# Patient Record
Sex: Female | Born: 1980 | Race: White | Hispanic: No | Smoking: Former smoker
Health system: Southern US, Community
[De-identification: ages and names within clinical notes are randomized; demographics above are authoritative.]

## PROBLEM LIST (undated history)

## (undated) DIAGNOSIS — G932 Benign intracranial hypertension: Secondary | ICD-10-CM

## (undated) DIAGNOSIS — F4310 Post-traumatic stress disorder, unspecified: Secondary | ICD-10-CM

## (undated) DIAGNOSIS — M5030 Other cervical disc degeneration, unspecified cervical region: Secondary | ICD-10-CM

## (undated) DIAGNOSIS — F988 Other specified behavioral and emotional disorders with onset usually occurring in childhood and adolescence: Secondary | ICD-10-CM

## (undated) DIAGNOSIS — F329 Major depressive disorder, single episode, unspecified: Secondary | ICD-10-CM

## (undated) DIAGNOSIS — M5136 Other intervertebral disc degeneration, lumbar region: Secondary | ICD-10-CM

## (undated) DIAGNOSIS — N289 Disorder of kidney and ureter, unspecified: Secondary | ICD-10-CM

## (undated) DIAGNOSIS — R569 Unspecified convulsions: Secondary | ICD-10-CM

## (undated) DIAGNOSIS — E079 Disorder of thyroid, unspecified: Secondary | ICD-10-CM

## (undated) DIAGNOSIS — F419 Anxiety disorder, unspecified: Secondary | ICD-10-CM

## (undated) DIAGNOSIS — G4733 Obstructive sleep apnea (adult) (pediatric): Secondary | ICD-10-CM

## (undated) HISTORY — PX: SHUNT REVISION VENTRICULAR-PERITONEAL: SHX6094

---

## 2003-08-27 ENCOUNTER — Encounter: Payer: Self-pay | Admitting: *Deleted

## 2003-08-27 ENCOUNTER — Encounter: Admission: RE | Admit: 2003-08-27 | Discharge: 2003-08-27 | Payer: Self-pay | Admitting: *Deleted

## 2017-01-18 ENCOUNTER — Ambulatory Visit (INDEPENDENT_AMBULATORY_CARE_PROVIDER_SITE_OTHER): Payer: Self-pay | Admitting: Sports Medicine

## 2017-01-18 DIAGNOSIS — M7711 Lateral epicondylitis, right elbow: Secondary | ICD-10-CM | POA: Insufficient documentation

## 2017-01-18 MED ORDER — MELOXICAM 15 MG PO TABS: ORAL_TABLET | ORAL | 3 refills | Status: DC
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
## 2017-02-15 ENCOUNTER — Ambulatory Visit: Payer: Self-pay | Admitting: Sports Medicine

## 2017-04-16 ENCOUNTER — Emergency Department (HOSPITAL_COMMUNITY): Payer: Self-pay

## 2017-04-16 ENCOUNTER — Encounter (HOSPITAL_COMMUNITY): Payer: Self-pay | Admitting: *Deleted

## 2017-04-16 ENCOUNTER — Emergency Department (HOSPITAL_COMMUNITY)
Admission: EM | Admit: 2017-04-16 | Discharge: 2017-04-16 | Disposition: A | Discharge status: Home / Self Care | Payer: Self-pay | Attending: Emergency Medicine | Admitting: Emergency Medicine

## 2017-04-16 DIAGNOSIS — R109 Unspecified abdominal pain: Secondary | ICD-10-CM | POA: Insufficient documentation

## 2017-04-16 DIAGNOSIS — F17210 Nicotine dependence, cigarettes, uncomplicated: Secondary | ICD-10-CM | POA: Insufficient documentation

## 2017-04-16 DIAGNOSIS — F17290 Nicotine dependence, other tobacco product, uncomplicated: Secondary | ICD-10-CM | POA: Insufficient documentation

## 2017-04-16 DIAGNOSIS — R55 Syncope and collapse: Secondary | ICD-10-CM | POA: Insufficient documentation

## 2017-04-16 LAB — COMPREHENSIVE METABOLIC PANEL
ALT: 19 U/L (ref 14–54)
ANION GAP: 9 (ref 5–15)
AST: 15 U/L (ref 15–41)
Albumin: 4 g/dL (ref 3.5–5.0)
Alkaline Phosphatase: 78 U/L (ref 38–126)
BILIRUBIN TOTAL: 0.6 mg/dL (ref 0.3–1.2)
BUN: 17 mg/dL (ref 6–20)
CHLORIDE: 109 mmol/L (ref 101–111)
CO2: 21 mmol/L — AB (ref 22–32)
Calcium: 8.6 mg/dL — ABNORMAL LOW (ref 8.9–10.3)
Creatinine, Ser: 0.82 mg/dL (ref 0.44–1.00)
GFR calc Af Amer: >60 mL/min (ref >60)
GFR calc non Af Amer: >60 mL/min (ref >60)
Glucose, Bld: 109 mg/dL — ABNORMAL HIGH (ref 65–99)
POTASSIUM: 3.7 mmol/L (ref 3.5–5.1)
SODIUM: 139 mmol/L (ref 135–145)
TOTAL PROTEIN: 7.4 g/dL (ref 6.5–8.1)

## 2017-04-16 LAB — URINALYSIS, ROUTINE W REFLEX MICROSCOPIC
BILIRUBIN URINE: NEGATIVE
GLUCOSE, UA: NEGATIVE mg/dL
KETONES UR: NEGATIVE mg/dL
NITRITE: NEGATIVE
PH: 6 (ref 5.0–8.0)
PROTEIN: 30 mg/dL — AB
Specific Gravity, Urine: 1.01 (ref 1.005–1.030)

## 2017-04-16 LAB — I-STAT BETA HCG BLOOD, ED (MC, WL, AP ONLY): I-stat hCG, quantitative: <5 m[IU]/mL (ref <5)

## 2017-04-16 LAB — CBC
HCT: 42.8 % (ref 36.0–46.0)
Hemoglobin: 14.8 g/dL (ref 12.0–15.0)
MCH: 31 pg (ref 26.0–34.0)
MCHC: 34.6 g/dL (ref 30.0–36.0)
MCV: 89.7 fL (ref 78.0–100.0)
PLATELETS: 343 10*3/uL (ref 150–400)
RBC: 4.77 MIL/uL (ref 3.87–5.11)
RDW: 12.9 % (ref 11.5–15.5)
WBC: 15 10*3/uL — ABNORMAL HIGH (ref 4.0–10.5)

## 2017-04-16 LAB — LIPASE, BLOOD: Lipase: 22 U/L (ref 11–51)

## 2017-04-16 MED ORDER — MORPHINE SULFATE (PF) 4 MG/ML IV SOLN
4.0000 mg | Freq: Once | INTRAVENOUS | Status: AC
  Administered 2017-04-16: 4 mg via INTRAVENOUS
  Filled 2017-04-16: qty 1

## 2017-04-16 MED ORDER — ONDANSETRON 8 MG PO TBDP: 8.0000 mg | ORAL_TABLET | Freq: Three times a day (TID) | ORAL | 0 refills | Status: DC | PRN

## 2017-04-16 MED ORDER — KETOROLAC TROMETHAMINE 30 MG/ML IJ SOLN
30.0000 mg | Freq: Once | INTRAMUSCULAR | Status: AC
  Administered 2017-04-16: 30 mg via INTRAVENOUS
  Filled 2017-04-16: qty 1

## 2017-04-16 MED ORDER — ONDANSETRON HCL 4 MG/2ML IJ SOLN
4.0000 mg | Freq: Once | INTRAMUSCULAR | Status: AC
  Administered 2017-04-16: 4 mg via INTRAVENOUS
  Filled 2017-04-16: qty 2

## 2017-04-16 NOTE — ED Triage Notes (Signed)
Pt c/o severe abdominal pain that caused her to pass out from the pain; pt was given zofran 4mg  en route by ems

## 2017-04-16 NOTE — ED Provider Notes (Signed)
[facility]-EMERGENCY DEPT Provider Note   CSN: [id number] Arrival date & time: 04/16/17  0051     History   Chief Complaint Chief Complaint  Patient presents with  . Loss of Consciousness    HPI Bethany [facility] is a 36 y.o. female.  HPI Patient presents to the emergency department complaining of severe abdominal pain with right flank pain radiating towards her right groin because her significant pain this evening.  She states the pain became so severe that she actually had a syncopal episode.  She was nauseated and vomited.  She was given Zofran by EMS.  No diarrhea.  No fevers or chills.  Denies dysuria or urinary frequency.  Patient has had kidney stones before.  She's also had gallstones.  None of her pain was upper abdominal in nature.  Her pain was severe in severity when it occurred.  She is still describing mild to moderate pain but reports it is significantly improved as compared to earlier.  No chest pain or shortness of breath   No past medical history on file.  Patient Active Problem List   Diagnosis Date Noted  . Lateral epicondylitis, right elbow 01/18/2017    No past surgical history on file.  OB History    No data available       Home Medications    Prior to Admission medications   Medication Sig Start Date End Date Taking? Authorizing Provider  Bethany [facility] (MOBIC) 15 MG tablet One tab PO qAM with breakfast for 2 weeks, then daily prn pain. 01/18/17   Monica Becton, MD    Family History No family history on file.  Social History Social History  Substance Use Topics  . Smoking status: Current Some Day Smoker    Packs/day: 0.25    Types: Cigarettes  . Smokeless tobacco: Current User  . Alcohol use Not on file     Allergies   Latex and Tramadol   Review of Systems Review of Systems  All other systems reviewed and are negative.    Physical Exam Updated Vital Signs BP 135/79   Pulse 75   Temp 98.1 F (36.7 C) (Oral)   Resp 19   Ht  5\' 7"  (1.702 m)   Wt 122.5 kg (270 lb)   LMP 04/13/2017   SpO2 96%   BMI 42.29 kg/m   Physical Exam  Constitutional: She is oriented to person, place, and time. She appears well-developed and well-nourished.  HENT:  Head: Normocephalic.  Eyes: EOM are normal.  Neck: Normal range of motion.  Cardiovascular: Normal rate and regular rhythm.   Pulmonary/Chest: Effort normal and breath sounds normal.  Abdominal: Soft. She exhibits no distension.  Musculoskeletal: Normal range of motion.  Neurological: She is alert and oriented to person, place, and time.  Psychiatric: She has a normal mood and affect.  Nursing note and vitals reviewed.    ED Treatments / Results  Labs (all labs ordered are listed, but only abnormal results are displayed) Labs Reviewed  CBC - Abnormal; Notable for the following:       Result Value   WBC 15.0 (*)    All other components within normal limits  COMPREHENSIVE METABOLIC PANEL - Abnormal; Notable for the following:    CO2 21 (*)    Glucose, Bld 109 (*)    Calcium 8.6 (*)    All other components within normal limits  URINALYSIS, ROUTINE W REFLEX MICROSCOPIC - Abnormal; Notable for the following:    Hgb urine dipstick  LARGE (*)    Protein, ur 30 (*)    Leukocytes, UA TRACE (*)    Bacteria, UA RARE (*)    Squamous Epithelial / LPF 0-5 (*)    All other components within normal limits  URINE CULTURE  LIPASE, BLOOD  I-STAT BETA HCG BLOOD, ED ([facility], [facility], [facility] ONLY)    EKG  EKG Interpretation None       Radiology Ct Renal Stone Study  Result Date: 04/16/2017 CLINICAL DATA:  Severe right-sided abdominal pain. History of kidney stones. EXAM: CT ABDOMEN AND PELVIS WITHOUT CONTRAST TECHNIQUE: Multidetector CT imaging of the abdomen and pelvis was performed following the standard protocol without IV contrast. COMPARISON:  02/23/2015 FINDINGS: Lower chest: Lung bases are clear, line for motion artifact. Hepatobiliary: No focal liver abnormality is seen. No  gallstones, gallbladder wall thickening, or biliary dilatation. Pancreas: Unremarkable. No pancreatic ductal dilatation or surrounding inflammatory changes. Spleen: Normal in size without focal abnormality. Adrenals/Urinary Tract: No adrenal gland nodules. Kidneys are symmetrical. No hydronephrosis or hydroureter. No renal, ureteral, or bladder stones. No bladder wall thickening. Stomach/Bowel: Stomach, small bowel, and colon are mostly decompressed. Scattered stool in the colon. No bowel wall thickening is appreciated. The appendix is normal. Vascular/Lymphatic: No significant vascular findings are present. No enlarged abdominal or pelvic lymph nodes. Reproductive: Uterus and bilateral adnexa are unremarkable. Small amount of free fluid in the pelvis is likely physiologic. Other: A ventricular peritoneal shunt tubing is present. No free air in the abdomen. Abdominal wall musculature appears intact. Musculoskeletal: No acute or significant osseous findings. IMPRESSION: No renal or ureteral stone or obstruction. No evidence of bowel obstruction or inflammation. Small amount of free fluid in the pelvis is likely physiologic. Electronically Signed   By: Burman NievesWilliam  Stevens M.D.   On: 04/16/2017 02:31    Procedures Procedures (including critical care time)  Medications Ordered in ED Medications  morphine 4 MG/ML injection 4 mg (4 mg Intravenous Given 04/16/17 0147)  ketorolac (TORADOL) 30 MG/ML injection 30 mg (30 mg Intravenous Given 04/16/17 0145)  ondansetron (ZOFRAN) injection 4 mg (4 mg Intravenous Given 04/16/17 0142)     Initial Impression / Assessment and Plan / ED Course  I have reviewed the triage vital signs and the nursing notes.  Pertinent labs & imaging results that were available during my care of the patient were reviewed by me and considered in my medical decision making (see chart for details).     Overall well-appearing.  Patient feels much better at this time.  CT scan demonstrates no  significant abnormality.  No ureteral stones noted.  She is on her menstrual cycle this time.  A small amount of hematuria was noted.  She could've recently passed a kidney stone.  That could cause pain like this.  Regardless she is feeling much better.  Close primary care follow-up.  She understands to return the emergency department for any new or worsening symptoms  Final Clinical Impressions(s) / ED Diagnoses   Final diagnoses:  Acute abdominal pain  Syncope, unspecified syncope type    New Prescriptions New Prescriptions   No medications on file     Azalia Bilisampos, Bryce Cheever, MD 04/16/17 [phone]

## 2017-04-17 LAB — URINE CULTURE

## 2019-02-12 ENCOUNTER — Telehealth: Payer: Self-pay | Admitting: Emergency Medicine

## 2019-02-16 LAB — NOVEL CORONAVIRUS, NAA: SARS-CoV-2, NAA: NOT DETECTED

## 2019-10-31 ENCOUNTER — Other Ambulatory Visit: Payer: Self-pay

## 2019-10-31 ENCOUNTER — Emergency Department (HOSPITAL_BASED_OUTPATIENT_CLINIC_OR_DEPARTMENT_OTHER)
Admission: EM | Admit: 2019-10-31 | Discharge: 2019-10-31 | Disposition: A | Discharge status: Home / Self Care | Payer: Medicaid Other | Attending: Emergency Medicine | Admitting: Emergency Medicine

## 2019-10-31 ENCOUNTER — Emergency Department (HOSPITAL_BASED_OUTPATIENT_CLINIC_OR_DEPARTMENT_OTHER): Payer: Medicaid Other

## 2019-10-31 ENCOUNTER — Encounter (HOSPITAL_BASED_OUTPATIENT_CLINIC_OR_DEPARTMENT_OTHER): Payer: Self-pay | Admitting: Emergency Medicine

## 2019-10-31 DIAGNOSIS — F17210 Nicotine dependence, cigarettes, uncomplicated: Secondary | ICD-10-CM | POA: Insufficient documentation

## 2019-10-31 DIAGNOSIS — N201 Calculus of ureter: Secondary | ICD-10-CM | POA: Insufficient documentation

## 2019-10-31 DIAGNOSIS — F17220 Nicotine dependence, chewing tobacco, uncomplicated: Secondary | ICD-10-CM | POA: Insufficient documentation

## 2019-10-31 HISTORY — DX: Major depressive disorder, single episode, unspecified: F32.9

## 2019-10-31 HISTORY — DX: Obstructive sleep apnea (adult) (pediatric): G47.33

## 2019-10-31 HISTORY — DX: Other cervical disc degeneration, unspecified cervical region: M50.30

## 2019-10-31 HISTORY — DX: Other specified behavioral and emotional disorders with onset usually occurring in childhood and adolescence: F98.8

## 2019-10-31 HISTORY — DX: Disorder of kidney and ureter, unspecified: N28.9

## 2019-10-31 HISTORY — DX: Unspecified convulsions: R56.9

## 2019-10-31 HISTORY — DX: Benign intracranial hypertension: G93.2

## 2019-10-31 HISTORY — DX: Post-traumatic stress disorder, unspecified: F43.10

## 2019-10-31 HISTORY — DX: Morbid (severe) obesity due to excess calories: E66.01

## 2019-10-31 HISTORY — DX: Other intervertebral disc degeneration, lumbar region: M51.36

## 2019-10-31 LAB — URINALYSIS, ROUTINE W REFLEX MICROSCOPIC
Bilirubin Urine: NEGATIVE
Glucose, UA: 100 mg/dL — AB
Ketones, ur: NEGATIVE mg/dL
Leukocytes,Ua: NEGATIVE
Nitrite: POSITIVE — AB
Protein, ur: NEGATIVE mg/dL
Specific Gravity, Urine: >1.03 — ABNORMAL HIGH (ref 1.005–1.030)
pH: 5.5 (ref 5.0–8.0)

## 2019-10-31 LAB — BASIC METABOLIC PANEL
Anion gap: 8 (ref 5–15)
BUN: 12 mg/dL (ref 6–20)
CO2: 23 mmol/L (ref 22–32)
Calcium: 8.5 mg/dL — ABNORMAL LOW (ref 8.9–10.3)
Chloride: 106 mmol/L (ref 98–111)
Creatinine, Ser: 0.85 mg/dL (ref 0.44–1.00)
GFR calc Af Amer: >60 mL/min (ref >60)
GFR calc non Af Amer: >60 mL/min (ref >60)
Glucose, Bld: 117 mg/dL — ABNORMAL HIGH (ref 70–99)
Potassium: 3.6 mmol/L (ref 3.5–5.1)
Sodium: 137 mmol/L (ref 135–145)

## 2019-10-31 LAB — URINALYSIS, MICROSCOPIC (REFLEX)

## 2019-10-31 LAB — PREGNANCY, URINE: Preg Test, Ur: NEGATIVE

## 2019-10-31 MED ORDER — KETOROLAC TROMETHAMINE 15 MG/ML IJ SOLN
15.0000 mg | Freq: Once | INTRAMUSCULAR | Status: AC
  Administered 2019-10-31: 05:00:00 15 mg via INTRAVENOUS

## 2019-10-31 MED ORDER — OXYCODONE-ACETAMINOPHEN 10-325 MG PO TABS: ORAL_TABLET | ORAL | 0 refills | Status: DC

## 2019-10-31 MED ORDER — KETOROLAC TROMETHAMINE 15 MG/ML IJ SOLN
INTRAMUSCULAR | Status: AC
  Administered 2019-10-31: 15 mg via INTRAVENOUS
  Filled 2019-10-31: qty 1

## 2019-10-31 MED ORDER — TAMSULOSIN HCL 0.4 MG PO CAPS
0.4000 mg | ORAL_CAPSULE | Freq: Once | ORAL | Status: AC
  Administered 2019-10-31: 0.4 mg via ORAL
  Filled 2019-10-31: qty 1

## 2019-10-31 MED ORDER — SODIUM CHLORIDE 0.9 % IV BOLUS
1000.0000 mL | Freq: Once | INTRAVENOUS | Status: AC
  Administered 2019-10-31: 1000 mL via INTRAVENOUS

## 2019-10-31 MED ORDER — ONDANSETRON 8 MG PO TBDP: 8.0000 mg | ORAL_TABLET | Freq: Three times a day (TID) | ORAL | 0 refills | Status: DC | PRN

## 2019-10-31 MED ORDER — HYDROMORPHONE HCL 1 MG/ML IJ SOLN
1.0000 mg | Freq: Once | INTRAMUSCULAR | Status: AC
  Administered 2019-10-31: 1 mg via INTRAVENOUS
  Filled 2019-10-31: qty 1

## 2019-10-31 MED ORDER — MELOXICAM 15 MG PO TABS: ORAL_TABLET | ORAL | 0 refills | Status: DC

## 2019-10-31 MED ORDER — TAMSULOSIN HCL 0.4 MG PO CAPS: ORAL_CAPSULE | ORAL | 0 refills | Status: AC

## 2019-10-31 MED ORDER — TAMSULOSIN HCL 0.4 MG PO CAPS: ORAL_CAPSULE | ORAL | 0 refills | Status: DC

## 2019-10-31 MED ORDER — MELOXICAM 15 MG PO TABS: ORAL_TABLET | ORAL | 0 refills | Status: AC

## 2019-10-31 MED ORDER — ONDANSETRON HCL 4 MG/2ML IJ SOLN
4.0000 mg | Freq: Once | INTRAMUSCULAR | Status: AC
Start: 2019-10-31 — End: 2019-10-31
  Administered 2019-10-31: 4 mg via INTRAVENOUS
  Filled 2019-10-31: qty 2

## 2019-10-31 NOTE — ED Triage Notes (Signed)
Pt c/o left lower abd pain and unable to urinate.

## 2019-10-31 NOTE — ED Provider Notes (Signed)
[facility] Provider Note: Bethany Dell, MD, FACEP  CSN: [id number] MRN: [id number] ARRIVAL: 10/31/19 at 0317 ROOM: [facility]   CHIEF COMPLAINT  Abdominal Pain   HISTORY OF PRESENT ILLNESS  10/31/19 3:53 AM Bethany [facility] is a 38 y.o. female with a history of kidney stones.  About 24 hours ago she thought she was getting a urinary tract infection with urinary urgency, discomfort with urination and voiding of small amounts.  She has subsequently developed left flank pain radiating to her left lower quadrant which she rates as a 10 out of 10.  It is not significantly changed with movement.  She has associated nausea.  She is unable to void at the present time though she feels the need to void.  She describes the pain is like previous kidney stones.   Past Medical History:  Diagnosis Date   ADD (attention deficit disorder)    DDD (degenerative disc disease), cervical    DDD (degenerative disc disease), lumbar    MDD (major depressive disorder)    Morbid obesity (HCC)    OSA (obstructive sleep apnea)    Pseudotumor    PTSD (post-traumatic stress disorder)    Renal disorder    Seizures (HCC)     Past Surgical History:  Procedure Laterality Date   SHUNT REVISION VENTRICULAR-PERITONEAL      No family history on file.  Social History   Tobacco Use   Smoking status: Current Some Day Smoker    Packs/day: 0.25    Types: Cigarettes   Smokeless tobacco: Current User  Substance Use Topics   Alcohol use: Not on file   Drug use: Not on file    Prior to Admission medications   Medication Sig Start Date End Date Taking? Authorizing Provider  meloxicam (MOBIC) 15 MG tablet Take 1 tablet daily until stone passes. 10/31/19   Kimika Streater, MD  ondansetron (ZOFRAN ODT) 8 MG disintegrating tablet Take 1 tablet (8 mg total) by mouth every 8 (eight) hours as needed. 10/31/19   Leeman Johnsey, MD  oxyCODONE-acetaminophen (PERCOCET) 10-325 MG tablet Take 1 tablet  every 4 hours as needed for severe pain. 10/31/19   Elson Ulbrich, MD  tamsulosin (FLOMAX) 0.4 MG CAPS capsule Take 1 capsule daily until stone passes. 10/31/19   Esthela Brandner, MD    Allergies Latex and Tramadol   REVIEW OF SYSTEMS  Negative except as noted here or in the History of Present Illness.   PHYSICAL EXAMINATION  Initial Vital Signs Blood pressure (!) 144/117, pulse 98, temperature 98.1 F (36.7 C), temperature source Oral, resp. rate (!) 22, height 5\' 7"  (1.702 m), weight 136.1 kg, SpO2 98 %.  Examination General: Well-developed, obese female in no acute distress; appearance consistent with age of record HENT: normocephalic; atraumatic Eyes: pupils equal, round and reactive to light; extraocular muscles intact Neck: supple Heart: regular rate and rhythm Lungs: clear to auscultation bilaterally Abdomen: soft; nondistended; suprapubic tenderness; bowel sounds present GU: Mild left CVA tenderness Extremities: No deformity; full range of motion; pulses normal Neurologic: Awake, alert and oriented; motor function intact in all extremities and symmetric; no facial droop Skin: Warm and dry Psychiatric: Grimacing   RESULTS  Summary of this visit's results, reviewed and interpreted by myself:   EKG Interpretation  Date/Time:    Ventricular Rate:    PR Interval:    QRS Duration:   QT Interval:    QTC Calculation:   R Axis:     Text Interpretation:  Laboratory Studies: Results for orders placed or performed during the hospital encounter of 10/31/19 (from the past 24 hour(s))  Urinalysis, Routine w reflex microscopic     Status: Abnormal   Collection Time: 10/31/19  4:03 AM  Result Value Ref Range   Color, Urine ORANGE (A) YELLOW   APPearance HAZY (A) CLEAR   Specific Gravity, Urine >1.030 (H) 1.005 - 1.030   pH 5.5 5.0 - 8.0   Glucose, UA 100 (A) NEGATIVE mg/dL   Hgb urine dipstick SMALL (A) NEGATIVE   Bilirubin Urine NEGATIVE NEGATIVE   Ketones, ur  NEGATIVE NEGATIVE mg/dL   Protein, ur NEGATIVE NEGATIVE mg/dL   Nitrite POSITIVE (A) NEGATIVE   Leukocytes,Ua NEGATIVE NEGATIVE  Pregnancy, urine     Status: None   Collection Time: 10/31/19  4:03 AM  Result Value Ref Range   Preg Test, Ur NEGATIVE NEGATIVE  Urinalysis, Microscopic (reflex)     Status: Abnormal   Collection Time: 10/31/19  4:03 AM  Result Value Ref Range   RBC / HPF 6-10 0 - 5 RBC/hpf   WBC, UA 0-5 0 - 5 WBC/hpf   Bacteria, UA RARE (A) NONE SEEN   Squamous Epithelial / LPF 0-5 0 - 5  Basic metabolic panel     Status: Abnormal   Collection Time: 10/31/19  4:04 AM  Result Value Ref Range   Sodium 137 135 - 145 mmol/L   Potassium 3.6 3.5 - 5.1 mmol/L   Chloride 106 98 - 111 mmol/L   CO2 23 22 - 32 mmol/L   Glucose, Bld 117 (H) 70 - 99 mg/dL   BUN 12 6 - 20 mg/dL   Creatinine, Ser 0.85 0.44 - 1.00 mg/dL   Calcium 8.5 (L) 8.9 - 10.3 mg/dL   GFR calc non Af Amer >60 >60 mL/min   GFR calc Af Amer >60 >60 mL/min   Anion gap 8 5 - 15   Imaging Studies: Ct Renal Stone Study  Result Date: 10/31/2019 CLINICAL DATA:  Per ED notes: Pt c/o left lower abd pain and unable to urinate. Flank pain, stone disease suspected EXAM: CT ABDOMEN AND PELVIS WITHOUT CONTRAST TECHNIQUE: Multidetector CT imaging of the abdomen and pelvis was performed following the standard protocol without IV contrast. COMPARISON:  CT 04/16/2017 FINDINGS: Lower chest: Lung bases are clear. Hepatobiliary: No focal hepatic lesion. No biliary duct dilatation. Gallbladder is normal. Common bile duct is normal. Pancreas: Pancreas is normal. No ductal dilatation. No pancreatic inflammation. Spleen: Normal spleen Adrenals/urinary tract: Adrenal glands normal. Tiny 1 mm calculus in the LEFT kidney. Mild hydroureter on the LEFT. Partially obstructing calculus in the distal LEFT ureter at the vesicoureteral junction. This distal LEFT ureteral stone measures 2-3 mm on image 74/2. No RIGHT nephrolithiasis or  ureterolithiasis. Stomach/Bowel: Stomach, small-bowel and cecum are normal. The appendix is not identified but there is no pericecal inflammation to suggest appendicitis. Stomach, small bowel, appendix, and cecum are normal. Vascular/Lymphatic: Abdominal aorta is normal caliber. No periportal or retroperitoneal adenopathy. No pelvic adenopathy. Reproductive: Uterus and adnexa normal. Other: Ventriculostomy catheter enters the RIGHT abdominal wall. Tip is midline in the lower ventral peritoneal space. No fluid collections. Musculoskeletal: No aggressive osseous lesion. IMPRESSION: 1. Partially obstructing calculus in the distal LEFT ureter at the vesicoureteral junction. 2. Tiny LEFT renal calculus. 3. VP shunt catheter shunt noted. Electronically Signed   By: Suzy Bouchard M.D.   On: 10/31/2019 04:43    ED COURSE and MDM  Nursing notes, initial and subsequent  vitals signs, including pulse oximetry, reviewed and interpreted by myself.  Vitals:   10/31/19 0334 10/31/19 0337 10/31/19 0527  BP:  (!) 144/117 (!) 114/102  Pulse:  98 73  Resp:  (!) 22 18  Temp:  98.1 F (36.7 C)   TempSrc:  Oral   SpO2:  98% 95%  Weight: 136.1 kg    Height: 5\' 7"  (1.702 m)     Medications  ondansetron (ZOFRAN) injection 4 mg (4 mg Intravenous Given 10/31/19 0402)  HYDROmorphone (DILAUDID) injection 1 mg (1 mg Intravenous Given 10/31/19 0403)  sodium chloride 0.9 % bolus 1,000 mL ( Intravenous Stopped 10/31/19 0535)  tamsulosin (FLOMAX) capsule 0.4 mg (0.4 mg Oral Given 10/31/19 0444)  ketorolac (TORADOL) 15 MG/ML injection 15 mg (15 mg Intravenous Given 10/31/19 0443)   5:20 AM Significant relief in pain after Dilaudid and Toradol.  The stone is small enough she should pass it on her own in the next few days.  PROCEDURES  Procedures   ED DIAGNOSES     ICD-10-CM   1. Ureteral stone  N20.1        Kea Callan, MD 10/31/19 [phone]

## 2019-11-01 LAB — URINE CULTURE: Culture: NO GROWTH

## 2021-12-03 ENCOUNTER — Encounter (HOSPITAL_BASED_OUTPATIENT_CLINIC_OR_DEPARTMENT_OTHER): Payer: Self-pay | Admitting: Emergency Medicine

## 2021-12-03 ENCOUNTER — Other Ambulatory Visit: Payer: Self-pay

## 2021-12-03 ENCOUNTER — Emergency Department (HOSPITAL_BASED_OUTPATIENT_CLINIC_OR_DEPARTMENT_OTHER)
Admission: EM | Admit: 2021-12-03 | Discharge: 2021-12-03 | Disposition: A | Discharge status: Home / Self Care | Payer: Medicaid Other | Attending: Emergency Medicine | Admitting: Emergency Medicine

## 2021-12-03 DIAGNOSIS — F13930 Sedative, hypnotic or anxiolytic use, unspecified with withdrawal, uncomplicated: Secondary | ICD-10-CM | POA: Insufficient documentation

## 2021-12-03 DIAGNOSIS — F410 Panic disorder [episodic paroxysmal anxiety] without agoraphobia: Secondary | ICD-10-CM | POA: Insufficient documentation

## 2021-12-03 DIAGNOSIS — R7309 Other abnormal glucose: Secondary | ICD-10-CM | POA: Insufficient documentation

## 2021-12-03 DIAGNOSIS — F419 Anxiety disorder, unspecified: Secondary | ICD-10-CM

## 2021-12-03 HISTORY — DX: Disorder of thyroid, unspecified: E07.9

## 2021-12-03 LAB — CBG MONITORING, ED: Glucose-Capillary: 98 mg/dL (ref 70–99)

## 2021-12-03 MED ORDER — DIPHENHYDRAMINE HCL 25 MG PO CAPS
25.0000 mg | ORAL_CAPSULE | Freq: Once | ORAL | Status: AC
  Administered 2021-12-03: 25 mg via ORAL
  Filled 2021-12-03: qty 1

## 2021-12-03 MED ORDER — LORAZEPAM 1 MG PO TABS
0.5000 mg | ORAL_TABLET | Freq: Once | ORAL | Status: AC
Start: 2021-12-03 — End: 2021-12-03
  Administered 2021-12-03: 0.5 mg via SUBLINGUAL
  Filled 2021-12-03: qty 1

## 2021-12-03 MED ORDER — ONDANSETRON 4 MG PO TBDP: 4.0000 mg | ORAL_TABLET | Freq: Once | ORAL | Status: DC

## 2021-12-03 NOTE — Discharge Instructions (Signed)
You were evaluated in the Emergency Department and after careful evaluation, we did not find any emergent condition requiring admission or further testing in the hospital.  Your exam/testing today was overall reassuring. Your symptoms are likely due to benzodiazepine withdrawal. You may have had a panic attack related to this. You do not require hospitalization for this currently as your symptoms are mild. Recommend your resume you home Xanax ER at discharge and follow-up with your PCP regarding long term use and management of this medication and your symptoms.  Please return to the Emergency Department if you experience any worsening of your condition.  Thank you for allowing [place] to be a part of your care.

## 2021-12-03 NOTE — ED Provider Notes (Signed)
[facility] EMERGENCY DEPARTMENT Provider Note   CSN: [id number] Arrival date & time: 12/03/21  0138     History  Chief Complaint  Patient presents with   Anxiety    Bethany Chandler is a 41 y.o. female.  HPI  41 year old female with a history of anxiety and depression on alprazolam 3 mg extended release who presents to the emergency department after for anxiety.  The patient states that she may have forgotten to take her extended release alprazolam for the last 2 to 3 days.  She has the medication at home and does not need a refill but thinks that she has not taken the medication for that time.  This evening, she developed a feeling of heart palpitations, chest tightness and nausea.  Symptoms are consistent with prior episodes of anxiety.  She felt flushed at that time.  She denied any active chest pain.  She has not taken her home alprazolam because she was worried about not taking the medicine for a few days and then suddenly restarting at 3 mg.  She was not sure if this was safe.  She denies any allergies other than to latex.  She did endorse some flushing of her skin with mild nausea.  No swelling in her mouth, tongue or lips.  No wheezing.  Home Medications Prior to Admission medications   Medication Sig Start Date End Date Taking? Authorizing Provider  meloxicam (MOBIC) 15 MG tablet Take 1 tablet daily until stone passes. 10/31/19   Molpus, John, MD  ondansetron (ZOFRAN ODT) 8 MG disintegrating tablet Take 1 tablet (8 mg total) by mouth every 8 (eight) hours as needed. 10/31/19   Molpus, John, MD  oxyCODONE-acetaminophen (PERCOCET) 10-325 MG tablet Take 1 tablet every 4 hours as needed for severe pain. 10/31/19   Molpus, John, MD  tamsulosin (FLOMAX) 0.4 MG CAPS capsule Take 1 capsule daily until stone passes. 10/31/19   Molpus, John, MD      Allergies    Latex and Tramadol    Review of Systems   Review of Systems  Physical Exam Updated Vital Signs BP (!) 140/99     Pulse 79    Temp 98.2 F (36.8 C) (Oral)    Resp 18    Ht 5' 6.5" (1.689 m)    Wt (!) 145.6 kg    LMP 08/08/2021    SpO2 97%    BMI 51.03 kg/m  Physical Exam Vitals and nursing note reviewed.  Constitutional:      General: She is not in acute distress.    Appearance: She is well-developed.  HENT:     Head: Normocephalic and atraumatic.  Eyes:     Conjunctiva/sclera: Conjunctivae normal.     Pupils: Pupils are equal, round, and reactive to light.  Cardiovascular:     Rate and Rhythm: Normal rate and regular rhythm.     Heart sounds: No murmur heard. Pulmonary:     Effort: Pulmonary effort is normal. No respiratory distress.     Breath sounds: Normal breath sounds. No wheezing.  Abdominal:     General: There is no distension.     Palpations: Abdomen is soft.     Tenderness: There is no abdominal tenderness. There is no guarding.  Musculoskeletal:        General: No swelling, deformity or signs of injury.     Cervical back: Neck supple.  Skin:    General: Skin is warm and dry.     Capillary Refill: Capillary  refill takes less than 2 seconds.     Findings: Erythema present. No lesion or rash.     Comments: Mild erythema noted along the bilateral upper extremities and along the chest wall without urticaria  Neurological:     General: No focal deficit present.     Mental Status: She is alert. Mental status is at baseline.  Psychiatric:        Mood and Affect: Mood normal.    ED Results / Procedures / Treatments   Labs (all labs ordered are listed, but only abnormal results are displayed) Labs Reviewed  CBG MONITORING, ED    EKG EKG Interpretation  Date/Time:  Sunday December 03 2021 02:05:04 EST Ventricular Rate:  93 PR Interval:  148 QRS Duration: 91 QT Interval:  365 QTC Calculation: 454 R Axis:   16 Text Interpretation: Sinus rhythm Low voltage, precordial leads Confirmed by Ernie Avena ([id number]) on 12/03/2021 2:18:41 AM  Radiology No results  found.  Procedures Procedures    Medications Ordered in ED Medications  diphenhydrAMINE (BENADRYL) capsule 25 mg (25 mg Oral Given 12/03/21 0229)  LORazepam (ATIVAN) tablet 0.5 mg (0.5 mg Sublingual Given 12/03/21 0229)    ED Course/ Medical Decision Making/ A&P                           Medical Decision Making  41 year old female with a history of anxiety and depression on alprazolam 3 mg extended release who presents to the emergency department after for anxiety.  The patient states that she may have forgotten to take her extended release alprazolam for the last 2 to 3 days.  She has the medication at home and does not need a refill but thinks that she has not taken the medication for that time.  This evening, she developed a feeling of heart palpitations, chest tightness and nausea.  Symptoms are consistent with prior episodes of anxiety.  She felt flushed at that time.  She denied any active chest pain.  She has not taken her home alprazolam because she was worried about not taking the medicine for a few days and then suddenly restarting at 3 mg.  She was not sure if this was safe.  She denies any allergies other than to latex.  She did endorse some flushing of her skin with mild nausea.  No swelling in her mouth, tongue or lips.  No wheezing.  On arrival, the patient was afebrile, hemodynamically stable, saturating well on room air.  Normal sinus rhythm noted on cardiac telemetry.  The patient presents with symptoms concerning for a panic attack in the setting of likely benzodiazepine withdrawal.  Her CIWA score on arrival was 86.  Symptoms have mostly resolved since arrival.  She did have some flushing of her skin noted bilaterally in her upper extremities and across her chest wall.  No mucous membrane swelling.  No wheezing noted on exam.  She does endorse mild nausea.  Additional differential diagnosis consideration includes an allergic reaction.  Patient was administered oral Benadryl 25 mg  and 0.5 mg of sublingual Ativan with some improvement in her symptoms.  Overall at this time she is well-appearing, ambulatory with a low enough CIWA score to be stable for discharge.  I did review the patient's medical record.  Her alprazolam is prescribed by her PCP for anxiety.  She most recently filled the medication on 11/21/2021 as per the PDMP.  She was advised to take her home dose  of Xanax and follow-up with her PCP for further long-term management of the medication.   Final Clinical Impression(s) / ED Diagnoses Final diagnoses:  Anxiety  Panic attack  Benzodiazepine withdrawal without complication [facility])    Rx / DC Orders ED Discharge Orders     None         Ernie Avena, MD 12/03/21 [phone]

## 2021-12-03 NOTE — ED Triage Notes (Signed)
Pt states she felt shaky and syncopal tonight. She thinks she may have forgotten to take her alprazolam 3 mg for 2-3 days. She states that she does have it at home but has just forgotten. She also complains of her cheeks being red and feeling hot.

## 2022-03-28 ENCOUNTER — Emergency Department (HOSPITAL_BASED_OUTPATIENT_CLINIC_OR_DEPARTMENT_OTHER): Payer: Self-pay

## 2022-03-28 ENCOUNTER — Other Ambulatory Visit: Payer: Self-pay

## 2022-03-28 ENCOUNTER — Emergency Department (HOSPITAL_BASED_OUTPATIENT_CLINIC_OR_DEPARTMENT_OTHER)
Admission: EM | Admit: 2022-03-28 | Discharge: 2022-03-28 | Disposition: A | Discharge status: Home / Self Care | Payer: Self-pay | Attending: Emergency Medicine | Admitting: Emergency Medicine

## 2022-03-28 ENCOUNTER — Encounter (HOSPITAL_BASED_OUTPATIENT_CLINIC_OR_DEPARTMENT_OTHER): Payer: Self-pay

## 2022-03-28 DIAGNOSIS — R202 Paresthesia of skin: Secondary | ICD-10-CM | POA: Insufficient documentation

## 2022-03-28 DIAGNOSIS — R002 Palpitations: Secondary | ICD-10-CM | POA: Insufficient documentation

## 2022-03-28 DIAGNOSIS — R0789 Other chest pain: Secondary | ICD-10-CM | POA: Insufficient documentation

## 2022-03-28 DIAGNOSIS — E039 Hypothyroidism, unspecified: Secondary | ICD-10-CM | POA: Insufficient documentation

## 2022-03-28 DIAGNOSIS — R110 Nausea: Secondary | ICD-10-CM | POA: Insufficient documentation

## 2022-03-28 DIAGNOSIS — Z91040 Latex allergy status: Secondary | ICD-10-CM | POA: Insufficient documentation

## 2022-03-28 DIAGNOSIS — R42 Dizziness and giddiness: Secondary | ICD-10-CM | POA: Insufficient documentation

## 2022-03-28 DIAGNOSIS — R0602 Shortness of breath: Secondary | ICD-10-CM | POA: Insufficient documentation

## 2022-03-28 HISTORY — DX: Anxiety disorder, unspecified: F41.9

## 2022-03-28 LAB — DIFFERENTIAL
Abs Immature Granulocytes: 0.03 10*3/uL (ref 0.00–0.07)
Basophils Absolute: 0.1 10*3/uL (ref 0.0–0.1)
Basophils Relative: 1 %
Eosinophils Absolute: 0.2 10*3/uL (ref 0.0–0.5)
Eosinophils Relative: 2 %
Immature Granulocytes: 0 %
Lymphocytes Relative: 26 %
Lymphs Abs: 2.6 10*3/uL (ref 0.7–4.0)
Monocytes Absolute: 0.7 10*3/uL (ref 0.1–1.0)
Monocytes Relative: 7 %
Neutro Abs: 6.3 10*3/uL (ref 1.7–7.7)
Neutrophils Relative %: 64 %

## 2022-03-28 LAB — COMPREHENSIVE METABOLIC PANEL
ALT: 23 U/L (ref 0–44)
AST: 21 U/L (ref 15–41)
Albumin: 4.2 g/dL (ref 3.5–5.0)
Alkaline Phosphatase: 96 U/L (ref 38–126)
Anion gap: 9 (ref 5–15)
BUN: 21 mg/dL — ABNORMAL HIGH (ref 6–20)
CO2: 25 mmol/L (ref 22–32)
Calcium: 8.8 mg/dL — ABNORMAL LOW (ref 8.9–10.3)
Chloride: 102 mmol/L (ref 98–111)
Creatinine, Ser: 0.88 mg/dL (ref 0.44–1.00)
GFR, Estimated: >60 mL/min (ref >60)
Glucose, Bld: 103 mg/dL — ABNORMAL HIGH (ref 70–99)
Potassium: 3.7 mmol/L (ref 3.5–5.1)
Sodium: 136 mmol/L (ref 135–145)
Total Bilirubin: 0.9 mg/dL (ref 0.3–1.2)
Total Protein: 7.9 g/dL (ref 6.5–8.1)

## 2022-03-28 LAB — CBC
HCT: 41.9 % (ref 36.0–46.0)
Hemoglobin: 14.1 g/dL (ref 12.0–15.0)
MCH: 29.5 pg (ref 26.0–34.0)
MCHC: 33.7 g/dL (ref 30.0–36.0)
MCV: 87.7 fL (ref 80.0–100.0)
Platelets: 400 10*3/uL (ref 150–400)
RBC: 4.78 MIL/uL (ref 3.87–5.11)
RDW: 13.8 % (ref 11.5–15.5)
WBC: 9.8 10*3/uL (ref 4.0–10.5)
nRBC: 0 % (ref 0.0–0.2)

## 2022-03-28 LAB — APTT: aPTT: 28 seconds (ref 24–36)

## 2022-03-28 LAB — CBG MONITORING, ED: Glucose-Capillary: 115 mg/dL — ABNORMAL HIGH (ref 70–99)

## 2022-03-28 LAB — PROTIME-INR
INR: 1.1 (ref 0.8–1.2)
Prothrombin Time: 13.6 seconds (ref 11.4–15.2)

## 2022-03-28 LAB — TROPONIN I (HIGH SENSITIVITY): Troponin I (High Sensitivity): 6 ng/L (ref <18)

## 2022-03-28 LAB — D-DIMER, QUANTITATIVE: D-Dimer, Quant: 0.28 ug/mL-FEU (ref 0.00–0.50)

## 2022-03-28 LAB — PREGNANCY, URINE: Preg Test, Ur: NEGATIVE

## 2022-03-28 MED ORDER — SODIUM CHLORIDE 0.9% FLUSH
3.0000 mL | Freq: Once | INTRAVENOUS | Status: DC
  Filled 2022-03-28: qty 3

## 2022-03-28 NOTE — Discharge Instructions (Signed)
You are seen in the emergency department today for shortness of breath, chest tightness and and flushed..  This is likely component of anxiety but may also be your high blood pressure.  Your work-up here has been reassuring.  I want you to follow-up with your primary care provider in the next 2 weeks.  Please return for any worsening symptoms. ?

## 2022-03-28 NOTE — ED Triage Notes (Addendum)
Pt presents with sudden onset of dizziness, ShOB, nausea that started at 17:00. Pt describes the dizziness as a boat rocking. EDP notified of pt's symptoms. While staff was drawing pt blood, pt started hyperventilating and c/o numbness in her lips. Pt became tearful.  ?

## 2022-03-28 NOTE — ED Provider Notes (Signed)
?[facility] EMERGENCY DEPARTMENT ?Provider Note ? ? ?CSN: [id number] ?Arrival date & time: 03/28/22  1927 ? ?  ? ?History ? ?Chief Complaint  ?Patient presents with  ? Shortness of Breath  ? ? ?Bethany Chandler is a 41 y.o. female. With past medical history of anxiety, OSA, pseudotumor cerebri s/p VP shunt placement, hypothyroidism, agoraphobia who presents to the emergency department for shortness of breath. ? ?Patient [facility] began to feel presyncopal. States she began feeling flushed and jittery. She has history of panic attacks and this felt similar. States she does not leave the house often and has frequent panic attacks, especially when driving on highway. States that she then had numbness in her jaw, and felt nauseated. When she got home she checked her BP which was 180/120. States that she laid down but symptoms persisted along with shortness of breath and chest tightness. She also had palpitations and dizziness. States that many of her symptoms have subsided, however she feels exhausted now, and continues to have some chest tightness with deep breaths. Currently denying numbness to her face, presyncope, nausea, palpitations. She has some residual dizziness but no visual changes, vomiting. No recent head trauma. She denies recent trips or flights. She does have implanted Nexplanon. Non smoker. No history of blood clots.  ? ? ?Shortness of Breath ?Associated symptoms: no chest pain   ? ?  ? ?Home Medications ?Prior to Admission medications   ?Medication Sig Start Date End Date Taking? Authorizing Provider  ?meloxicam (MOBIC) 15 MG tablet Take 1 tablet daily until stone passes. 10/31/19   Molpus, John, MD  ?ondansetron (ZOFRAN ODT) 8 MG disintegrating tablet Take 1 tablet (8 mg total) by mouth every 8 (eight) hours as needed. 10/31/19   Molpus, John, MD  ?oxyCODONE-acetaminophen (PERCOCET) 10-325 MG tablet Take 1 tablet every 4 hours as needed for  severe pain. 10/31/19   Molpus, John, MD  ?tamsulosin (FLOMAX) 0.4 MG CAPS capsule Take 1 capsule daily until stone passes. 10/31/19   Molpus, Jonny RuizJohn, MD  ?   ? ?Allergies    ?Latex and Tramadol   ? ?Review of Systems   ?Review of Systems  ?Constitutional:  Positive for fatigue.  ?Respiratory:  Positive for chest tightness and shortness of breath.   ?Cardiovascular:  Positive for palpitations. Negative for chest pain and leg swelling.  ?Gastrointestinal:  Positive for nausea.  ?Neurological:  Positive for dizziness and numbness.  ?Psychiatric/Behavioral:  The patient is nervous/anxious.   ?All other systems reviewed and are negative. ? ?Physical Exam ?Updated Vital Signs ?BP (!) 142/85   Pulse 80   Temp 98.1 ?F (36.7 ?C)   Resp 12   Ht 5' 6.5" (1.689 m)   Wt (!) 147.4 kg   SpO2 99%   BMI 51.67 kg/m?  ?Physical Exam ?Vitals and nursing note reviewed.  ?Constitutional:   ?   General: She is not in acute distress. ?   Appearance: Normal appearance. She is well-developed. She is obese. She is not ill-appearing or toxic-appearing.  ?HENT:  ?   Head: Normocephalic and atraumatic.  ?   Nose: Nose normal.  ?   Mouth/Throat:  ?   Mouth: Mucous membranes are moist.  ?   Pharynx: Oropharynx is clear.  ?Eyes:  ?   General: No scleral icterus. ?   Extraocular Movements: Extraocular movements intact.  ?   Pupils: Pupils are equal, round, and reactive to light.  ?Neck:  ?  Vascular: No JVD.  ?Cardiovascular:  ?   Rate and Rhythm: Normal rate and regular rhythm.  ?   Pulses: Normal pulses.  ?   Heart sounds: Normal heart sounds. No murmur heard. ?Pulmonary:  ?   Effort: Pulmonary effort is normal. No tachypnea, accessory muscle usage or respiratory distress.  ?   Breath sounds: Normal breath sounds. No decreased breath sounds, wheezing, rhonchi or rales.  ?Chest:  ?   Chest wall: No tenderness.  ?Abdominal:  ?   General: Bowel sounds are normal.  ?   Palpations: Abdomen is soft.  ?Musculoskeletal:     ?   General: Normal range  of motion.  ?   Cervical back: Normal range of motion and neck supple.  ?   Right lower leg: No edema.  ?   Left lower leg: No edema.  ?Skin: ?   General: Skin is warm and dry.  ?   Capillary Refill: Capillary refill takes less than 2 seconds.  ?   Coloration: Skin is not cyanotic.  ?Neurological:  ?   General: No focal deficit present.  ?   Mental Status: She is alert and oriented to person, place, and time. Mental status is at baseline.  ?   Cranial Nerves: No cranial nerve deficit.  ?   Sensory: No sensory deficit.  ?   Motor: No weakness.  ?Psychiatric:     ?   Attention and Perception: Attention normal.     ?   Mood and Affect: Mood is anxious.     ?   Speech: Speech is rapid and pressured.     ?   Behavior: Behavior normal. Behavior is cooperative.     ?   Thought Content: Thought content normal.     ?   Cognition and Memory: Cognition and memory normal.     ?   Judgment: Judgment normal.  ? ? ?ED Results / Procedures / Treatments   ?Labs ?(all labs ordered are listed, but only abnormal results are displayed) ?Labs Reviewed  ?COMPREHENSIVE METABOLIC PANEL - Abnormal; Notable for the following components:  ?    Result Value  ? Glucose, Bld 103 (*)   ? BUN 21 (*)   ? Calcium 8.8 (*)   ? All other components within normal limits  ?CBG MONITORING, ED - Abnormal; Notable for the following components:  ? Glucose-Capillary 115 (*)   ? All other components within normal limits  ?PROTIME-INR  ?APTT  ?CBC  ?DIFFERENTIAL  ?PREGNANCY, URINE  ?D-DIMER, QUANTITATIVE  ?TROPONIN I (HIGH SENSITIVITY)  ?TROPONIN I (HIGH SENSITIVITY)  ? ?EKG ?EKG Interpretation ? ?Date/Time:  Wednesday Mar 28 2022 19:38:57 EDT ?Ventricular Rate:  86 ?PR Interval:  140 ?QRS Duration: 80 ?QT Interval:  358 ?QTC Calculation: 428 ?R Axis:   28 ?Text Interpretation: Normal sinus rhythm Cannot rule out Anterior infarct , age undetermined Abnormal ECG When compared with ECG of 03-Dec-2021 02:05, No significant change since last tracing Confirmed by  Alvira Monday ([id number]) on 03/29/2022 9:44:10 AM ? ?Radiology ?DG Chest 2 View ? ?Result Date: 03/28/2022 ?CLINICAL DATA:  Chest tightness. EXAM: CHEST - 2 VIEW COMPARISON:  Chest x-ray 06/28/2020. FINDINGS: Loop recorder device overlies the heart, unchanged. The heart size and mediastinal contours are within normal limits. Both lungs are clear. The visualized skeletal structures are unremarkable. IMPRESSION: No active cardiopulmonary disease. Electronically Signed   By: Darliss Cheney M.D.   On: 03/28/2022 22:39  ? ?CT HEAD WO CONTRAST ? ?Result Date:  03/28/2022 ?CLINICAL DATA:  Dizziness EXAM: CT HEAD WITHOUT CONTRAST TECHNIQUE: Contiguous axial images were obtained from the base of the skull through the vertex without intravenous contrast. RADIATION DOSE REDUCTION: This exam was performed according to the departmental dose-optimization program which includes automated exposure control, adjustment of the mA and/or kV according to patient size and/or use of iterative reconstruction technique. COMPARISON:  06/27/2020 FINDINGS: Brain: No evidence of acute infarction, hemorrhage, hydrocephalus, extra-axial collection or mass lesion/mass effect. Ventriculostomy catheter is noted on the right. No ventricular dilatation is seen. Vascular: No hyperdense vessel or unexpected calcification. Skull: Normal. Negative for fracture or focal lesion. Sinuses/Orbits: No acute finding. Other: None. IMPRESSION: No acute intracranial abnormality noted. No change from the prior exam. Electronically Signed   By: Alcide Clever M.D.   On: 03/28/2022 20:25   ? ?Procedures ?Procedures  ? ? ?Medications Ordered in ED ?Medications - No data to display ? ? ?ED Course/ Medical Decision Making/ A&P ?  ?                        ?Medical Decision Making ?Amount and/or Complexity of Data Reviewed ?Labs: ordered. ?Radiology: ordered. ? ?This patient presents to the ED for concern of shortness of breath, dizziness, this involves an extensive number of  treatment options, and is a complaint that carries with it a high risk of complications and morbidity.  The differential diagnosis includes ACS, PE, PTX, CHF, panic attack/anxiety, dissection, etc ? ?Co morbidities that

## 2022-03-28 NOTE — ED Provider Notes (Signed)
I provided a substantive portion of the care of this patient.  I personally performed the entirety of the medical decision making for this encounter. ? ?  ?I assessed the patient on arrival in triage to determine if code stroke indicated.  Patient had multiple symptoms including feeling like she might pass out numbness in her face, dizziness and nausea.  I performed screening neurologic exam and no deficits present.  Recommendation was to proceed with stroke work-up including CT head but no activation of code stroke. ? ?Patient's diagnostic evaluation has been negative.  All symptoms have resolved and blood pressure is normalizing without medication.  Agree patient is stable for discharge with a follow-up plan in place and return precautions for returning to the emergency department. ?  ?Charlesetta Shanks, MD ?04/02/22 1118 ? ?

## 2022-03-28 NOTE — ED Notes (Signed)
Per EDP no code stroke at this time.  ?

## 2022-03-28 NOTE — ED Notes (Signed)
Took pt to bathroom via w/c, as she said she was still feeling dizzy. On return to room, she said she was still feeling dizzy and just "feel exhausted" said she felt more tightness around her chest. Vitals assessed, pt says she wanted to sit on the side of the bed, because she felt "panicky"  ?

## 2022-04-05 ENCOUNTER — Encounter (HOSPITAL_COMMUNITY): Payer: Self-pay | Admitting: Emergency Medicine

## 2022-04-05 ENCOUNTER — Other Ambulatory Visit: Payer: Self-pay

## 2022-04-05 ENCOUNTER — Emergency Department (HOSPITAL_COMMUNITY)
Admission: EM | Admit: 2022-04-05 | Discharge: 2022-04-05 | Disposition: A | Discharge status: Home / Self Care | Payer: 59 | Attending: Emergency Medicine | Admitting: Emergency Medicine

## 2022-04-05 ENCOUNTER — Emergency Department (HOSPITAL_COMMUNITY): Payer: 59

## 2022-04-05 DIAGNOSIS — R001 Bradycardia, unspecified: Secondary | ICD-10-CM | POA: Diagnosis not present

## 2022-04-05 DIAGNOSIS — R112 Nausea with vomiting, unspecified: Secondary | ICD-10-CM | POA: Insufficient documentation

## 2022-04-05 DIAGNOSIS — I959 Hypotension, unspecified: Secondary | ICD-10-CM | POA: Diagnosis not present

## 2022-04-05 DIAGNOSIS — D72829 Elevated white blood cell count, unspecified: Secondary | ICD-10-CM | POA: Diagnosis not present

## 2022-04-05 DIAGNOSIS — R55 Syncope and collapse: Secondary | ICD-10-CM | POA: Diagnosis not present

## 2022-04-05 DIAGNOSIS — Z91040 Latex allergy status: Secondary | ICD-10-CM | POA: Diagnosis not present

## 2022-04-05 DIAGNOSIS — R197 Diarrhea, unspecified: Secondary | ICD-10-CM | POA: Diagnosis not present

## 2022-04-05 DIAGNOSIS — Z79899 Other long term (current) drug therapy: Secondary | ICD-10-CM | POA: Insufficient documentation

## 2022-04-05 DIAGNOSIS — R000 Tachycardia, unspecified: Secondary | ICD-10-CM | POA: Insufficient documentation

## 2022-04-05 DIAGNOSIS — M25511 Pain in right shoulder: Secondary | ICD-10-CM | POA: Insufficient documentation

## 2022-04-05 LAB — CBC WITH DIFFERENTIAL/PLATELET
Abs Immature Granulocytes: 0.11 10*3/uL — ABNORMAL HIGH (ref 0.00–0.07)
Basophils Absolute: 0.1 10*3/uL (ref 0.0–0.1)
Basophils Relative: 0 %
Eosinophils Absolute: 0.1 10*3/uL (ref 0.0–0.5)
Eosinophils Relative: 1 %
HCT: 45.6 % (ref 36.0–46.0)
Hemoglobin: 15.3 g/dL — ABNORMAL HIGH (ref 12.0–15.0)
Immature Granulocytes: 1 %
Lymphocytes Relative: 8 %
Lymphs Abs: 1.5 10*3/uL (ref 0.7–4.0)
MCH: 30.2 pg (ref 26.0–34.0)
MCHC: 33.6 g/dL (ref 30.0–36.0)
MCV: 89.9 fL (ref 80.0–100.0)
Monocytes Absolute: 1.4 10*3/uL — ABNORMAL HIGH (ref 0.1–1.0)
Monocytes Relative: 7 %
Neutro Abs: 16 10*3/uL — ABNORMAL HIGH (ref 1.7–7.7)
Neutrophils Relative %: 83 %
Platelets: 352 10*3/uL (ref 150–400)
RBC: 5.07 MIL/uL (ref 3.87–5.11)
RDW: 14.3 % (ref 11.5–15.5)
WBC: 19.3 10*3/uL — ABNORMAL HIGH (ref 4.0–10.5)
nRBC: 0 % (ref 0.0–0.2)

## 2022-04-05 LAB — LIPASE, BLOOD: Lipase: 28 U/L (ref 11–51)

## 2022-04-05 LAB — I-STAT BETA HCG BLOOD, ED (MC, WL, AP ONLY): I-stat hCG, quantitative: <5 m[IU]/mL (ref <5)

## 2022-04-05 LAB — MAGNESIUM: Magnesium: 2.2 mg/dL (ref 1.7–2.4)

## 2022-04-05 LAB — COMPREHENSIVE METABOLIC PANEL
ALT: 20 U/L (ref 0–44)
AST: 23 U/L (ref 15–41)
Albumin: 4 g/dL (ref 3.5–5.0)
Alkaline Phosphatase: 95 U/L (ref 38–126)
Anion gap: 8 (ref 5–15)
BUN: 22 mg/dL — ABNORMAL HIGH (ref 6–20)
CO2: 24 mmol/L (ref 22–32)
Calcium: 8.6 mg/dL — ABNORMAL LOW (ref 8.9–10.3)
Chloride: 107 mmol/L (ref 98–111)
Creatinine, Ser: 0.89 mg/dL (ref 0.44–1.00)
GFR, Estimated: >60 mL/min (ref >60)
Glucose, Bld: 121 mg/dL — ABNORMAL HIGH (ref 70–99)
Potassium: 4.8 mmol/L (ref 3.5–5.1)
Sodium: 139 mmol/L (ref 135–145)
Total Bilirubin: 0.9 mg/dL (ref 0.3–1.2)
Total Protein: 7.3 g/dL (ref 6.5–8.1)

## 2022-04-05 LAB — TROPONIN I (HIGH SENSITIVITY)
Troponin I (High Sensitivity): 6 ng/L (ref <18)
Troponin I (High Sensitivity): 5 ng/L (ref <18)

## 2022-04-05 MED ORDER — IOHEXOL 350 MG/ML SOLN
100.0000 mL | Freq: Once | INTRAVENOUS | Status: AC | PRN
  Administered 2022-04-05: 80 mL via INTRAVENOUS

## 2022-04-05 MED ORDER — METHOCARBAMOL 1000 MG/10ML IJ SOLN: 1000.0000 mg | Freq: Once | INTRAMUSCULAR | Status: DC

## 2022-04-05 MED ORDER — SODIUM CHLORIDE 0.9 % IV SOLN
1000.0000 mL | INTRAVENOUS | Status: DC
  Administered 2022-04-05: 1000 mL via INTRAVENOUS

## 2022-04-05 MED ORDER — ONDANSETRON HCL 4 MG/2ML IJ SOLN
INTRAMUSCULAR | Status: AC
  Administered 2022-04-05: 4 mg via INTRAVENOUS
  Filled 2022-04-05: qty 2

## 2022-04-05 MED ORDER — KETOROLAC TROMETHAMINE 15 MG/ML IJ SOLN
15.0000 mg | Freq: Once | INTRAMUSCULAR | Status: AC
  Administered 2022-04-05: 15 mg via INTRAVENOUS
  Filled 2022-04-05: qty 1

## 2022-04-05 MED ORDER — ONDANSETRON HCL 4 MG/2ML IJ SOLN: 4.0000 mg | Freq: Once | INTRAMUSCULAR | Status: AC

## 2022-04-05 MED ORDER — SODIUM CHLORIDE 0.9 % IV BOLUS (SEPSIS)
1000.0000 mL | Freq: Once | INTRAVENOUS | Status: AC
  Administered 2022-04-05: 1000 mL via INTRAVENOUS

## 2022-04-05 MED ORDER — METHOCARBAMOL 1000 MG/10ML IJ SOLN
1000.0000 mg | Freq: Once | INTRAVENOUS | Status: AC
  Administered 2022-04-05: 1000 mg via INTRAVENOUS
  Filled 2022-04-05: qty 10

## 2022-04-05 NOTE — ED Provider Notes (Signed)
Patient received in signout pending repeat troponin and ambulation.  Patient ambulated to the bathroom with baseline strength/gait.  Repeat troponin is negative.  Plan for discharge and outpatient follow-up.  She is otherwise well-appearing with no acute complaints at this time. ? ?Patient at this time appears safe and stable for discharge and close outpatient follow up. Discharge plan and strict return to ED precautions discussed, patient verbalizes understanding and agreement. ?  Rozelle Logan, DO ?04/05/22 1026 ? ?

## 2022-04-05 NOTE — ED Notes (Addendum)
Patient ambulated to bathroom with one person assist

## 2022-04-05 NOTE — ED Provider Notes (Signed)
?[facility] ?Provider Note ? ?CSN: [id number] ?Arrival date & time: 04/05/22 0251 ? ?Chief Complaint(s) ?Bradycardia and Hypotension ? ?HPI ?Bethany Chandler is a 41 y.o. 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  Patient reports that she has been having intermittent right shoulder girdle pain radiating across her right chest.  This began earlier this afternoon.  No substernal chest pain.  No shortness of breath.  This evening, after dinner she began having the worsening nausea and vomiting.  Syncopal episode occurred while she was in the restroom.  She reports she might of fallen and hit her head.  She was able to get up, call her daughter, walk to the door to let her daughter in.  Reports that she felt out of it during that time.  ? ?EMS was called.  In route they noted became bradycardic and hypotensive during an episode of nausea.  They began pacing the patient and gave her Versed. ? ?Patient denies any recent fevers or infections.  No coughing or congestion.  ? ?Patient did report recently being placed on new blood pressure medicine (Micardis) by her PCP.  ? ?On record review patient was also taken off of phentermine last month.  No other medication changes ? ?The history is provided by the patient, the EMS personnel and a relative.  ? ?Past Medical History ?Past Medical History:  ?Diagnosis Date  ? ADD (attention deficit disorder)   ? Anxiety   ? DDD (degenerative disc disease), cervical   ? DDD (degenerative disc disease), lumbar   ? MDD (major depressive disorder)   ? Morbid obesity ([place])   ? OSA (obstructive sleep apnea)   ? Pseudotumor   ? PTSD (post-traumatic stress disorder)   ? Renal disorder   ? Seizures ([place])   ? Thyroid disease   ? ?Patient Active Problem List  ? Diagnosis Date Noted  ? Lateral epicondylitis, right elbow  01/18/2017  ? ?Home Medication(s) ?Prior to Admission medications   ?Medication Sig Start Date End Date Taking? Authorizing Provider  ?acetaminophen (TYLENOL) 500 MG tablet Take 500 mg by mouth every 6 (six) hours as needed for moderate pain or headache.   Yes [provider]  ?ALPRAZolam (XANAX XR) 3 MG 24 hr tablet Take 3 mg by mouth every morning. 03/23/22  Yes [provider]  ?cetirizine (ZYRTEC) 10 MG tablet Take 10 mg by mouth at bedtime. 03/23/22  Yes [provider]  ?cyclobenzaprine (FLEXERIL) 10 MG tablet Take 10 mg by mouth at bedtime. 03/23/22  Yes [provider]  ?gabapentin (NEURONTIN) 300 MG capsule Take 300 mg by mouth 3 (three) times daily. 02/27/22  Yes [provider]  ?levothyroxine (SYNTHROID) 100 MCG tablet Take 100 mcg by mouth daily. 02/27/22  Yes [provider]  ?telmisartan (MICARDIS) 40 MG tablet Take 40 mg by mouth at bedtime. 03/30/22  Yes [provider]  ?Vitamin D, Ergocalciferol, (DRISDOL) 1.25 MG (50000 UNIT) CAPS capsule Take 50,000 Units by mouth every Sunday. 02/26/22  Yes [provider]  ?meloxicam (MOBIC) 15 MG tablet Take 1 tablet daily until stone passes. ?Patient not taking: Reported on 04/05/2022 10/31/19   Molpus, Jenny Reichmann, MD  ?ondansetron (ZOFRAN ODT) 8 MG disintegrating tablet Take 1 tablet (8 mg total) by mouth every 8 (eight) hours as needed. ?Patient not taking: Reported on 04/05/2022 10/31/19   Molpus,  John, MD  ?oxyCODONE-acetaminophen (PERCOCET) 10-325 MG tablet Take 1 tablet every 4 hours as needed for severe pain. ?Patient not taking: Reported on 04/05/2022 10/31/19   Molpus, John, MD  ?tamsulosin (FLOMAX) 0.4 MG CAPS capsule Take 1 capsule daily until stone passes. ?Patient not taking: Reported on 04/05/2022 10/31/19   Molpus, Jenny Reichmann, MD  ?                                                                                                                                  ?Allergies ?Latex and Tramadol ? ?Review of  Systems ?Review of Systems ?As noted in HPI ? ?Physical Exam ?Vital Signs  ?I have reviewed the triage vital signs ?BP (!) 108/56   Pulse 95   Temp 97.6 ?F (36.4 ?C) (Oral)   Resp 19   SpO2 98%  ? ?Physical Exam ?Vitals reviewed.  ?Constitutional:   ?   General: She is not in acute distress. ?   Appearance: She is well-developed. She is not diaphoretic.  ?HENT:  ?   Head: Normocephalic and atraumatic.  ?   Nose: Nose normal.  ?Eyes:  ?   General: No scleral icterus.    ?   Right eye: No discharge.     ?   Left eye: No discharge.  ?   Conjunctiva/sclera: Conjunctivae normal.  ?   Pupils: Pupils are equal, round, and reactive to light.  ?Cardiovascular:  ?   Rate and Rhythm: Regular rhythm. Tachycardia present.  ?   Heart sounds: No murmur heard. ?  No friction rub. No gallop.  ?Pulmonary:  ?   Effort: Pulmonary effort is normal. No respiratory distress.  ?   Breath sounds: Normal breath sounds. No stridor. No rales.  ?Abdominal:  ?   General: There is no distension.  ?   Palpations: Abdomen is soft.  ?   Tenderness: There is no abdominal tenderness.  ?Musculoskeletal:     ?   General: No tenderness.  ?   Cervical back: Normal range of motion and neck supple.  ?Skin: ?   General: Skin is warm and dry.  ?   Findings: No erythema or rash.  ?Neurological:  ?   Mental Status: She is alert and oriented to person, place, and time.  ? ? ?ED Results and Treatments ?Labs ?(all labs ordered are listed, but only abnormal results are displayed) ?Labs Reviewed  ?CBC WITH DIFFERENTIAL/PLATELET - Abnormal; Notable for the following components:  ?    Result Value  ? WBC 19.3 (*)   ? Hemoglobin 15.3 (*)   ? Neutro Abs 16.0 (*)   ? Monocytes Absolute 1.4 (*)   ? Abs Immature Granulocytes 0.11 (*)   ? All other components within normal limits  ?COMPREHENSIVE METABOLIC PANEL - Abnormal; Notable for the following components:  ? Glucose, Bld 121 (*)   ? BUN 22 (*)   ? Calcium 8.6 (*)   ?  All other components within normal limits   ?MAGNESIUM  ?LIPASE, BLOOD  ?I-STAT BETA HCG BLOOD, ED ([facility], [facility], [facility] ONLY)  ?TROPONIN I (HIGH SENSITIVITY)  ?TROPONIN I (HIGH SENSITIVITY)  ?                                                                                                                       ?EKG ? EKG Interpretation ? ?Date/Time:  Thursday Apr 05 2022 03:23:57 EDT ?Ventricular Rate:  94 ?PR Interval:  147 ?QRS Duration: 91 ?QT Interval:  368 ?QTC Calculation: 461 ?R Axis:   24 ?Text Interpretation: Sinus rhythm Low voltage, precordial leads Confirmed by Addison Lank [phone]) on 04/05/2022 7:44:51 AM ?  ? ?  ? ?Radiology ?DG Skull 1-3 Views ? ?Result Date: 04/05/2022 ?CLINICAL DATA:  Evaluate for shunt malfunction. EXAM: SKULL - 1-3 VIEW COMPARISON:  06/27/2020 FINDINGS: Shunt tubing enters the frontal bone on the right and terminates to the left of midline. The shunt tubing courses over the scalp and cervical region on the right the shunt tubing compares continuous and without evidence of fracture. The calvarium is otherwise intact. The paranasal sinuses are clear. IMPRESSION: Right ventriculoperitoneal shunt. There is no evidence of tubing fracture or discontinuity in the calvarium, scalp, or cervical region on the right. Electronically Signed   By: Brett Fairy M.D.   On: 04/05/2022 04:24  ? ?DG Cervical Spine 1 View ? ?Result Date: 04/05/2022 ?CLINICAL DATA:  Evaluation catheter EXAM: DG CERVICAL SPINE - 1 VIEW COMPARISON:  None Available. FINDINGS: Programmable shunt which traverses the right neck, inintact where seen. The intracranial portion is covered on dedicated head CT. No incidental osseous or soft tissue finding. IMPRESSION: Intact shunt tubing over the right neck. Electronically Signed   By: Jorje Guild M.D.   On: 04/05/2022 04:19  ? ?DG Abd 1 View ? ?Result Date: 04/05/2022 ?CLINICAL DATA:  Evaluate for shunt malfunction. EXAM: ABDOMEN - 1 VIEW COMPARISON:  Abdominal CT 10/31/2019 FINDINGS: Shunt tubing traverses the right upper  quadrant and crosses over to the left lower quadrant where the tip resides. The shunt catheter is intact where covered. No concerning mass effect around the tip. Normal bowel gas pattern. IMPRESSION: Unremarkable shunt tub

## 2022-04-05 NOTE — Discharge Instructions (Addendum)
Your work-up in the emergency department was normal.  Continue to take your home medications as prescribed.  Return for any worsening symptoms or further concerns for your health. ?

## 2022-04-05 NOTE — ED Triage Notes (Addendum)
Patient from home, complaining of chest pain, nausea and vomiting.  Patient suddenly bradys down to 30's, dizziness, diaphoretic. Patient was paced on the way to ED with [facility]. She had been given 2.5mg  midazolam during pacing. Upon arrival to ED, patient is now NSR on monitor, rate of 80's.  CBG of 115. ?

## 2022-04-05 NOTE — ED Notes (Signed)
Patient transported to CT 

## 2022-04-10 ENCOUNTER — Encounter (HOSPITAL_COMMUNITY): Payer: Self-pay | Admitting: Emergency Medicine

## 2022-04-10 ENCOUNTER — Other Ambulatory Visit: Payer: Self-pay

## 2022-04-10 ENCOUNTER — Emergency Department (HOSPITAL_COMMUNITY)
Admission: EM | Admit: 2022-04-10 | Discharge: 2022-04-10 | Disposition: A | Discharge status: Home / Self Care | Payer: 59 | Attending: Emergency Medicine | Admitting: Emergency Medicine

## 2022-04-10 ENCOUNTER — Emergency Department (HOSPITAL_COMMUNITY): Payer: 59

## 2022-04-10 DIAGNOSIS — I10 Essential (primary) hypertension: Secondary | ICD-10-CM | POA: Insufficient documentation

## 2022-04-10 DIAGNOSIS — R1010 Upper abdominal pain, unspecified: Secondary | ICD-10-CM | POA: Diagnosis not present

## 2022-04-10 DIAGNOSIS — R112 Nausea with vomiting, unspecified: Secondary | ICD-10-CM | POA: Insufficient documentation

## 2022-04-10 DIAGNOSIS — R197 Diarrhea, unspecified: Secondary | ICD-10-CM | POA: Insufficient documentation

## 2022-04-10 DIAGNOSIS — M546 Pain in thoracic spine: Secondary | ICD-10-CM | POA: Insufficient documentation

## 2022-04-10 DIAGNOSIS — R0789 Other chest pain: Secondary | ICD-10-CM | POA: Diagnosis not present

## 2022-04-10 DIAGNOSIS — Z79899 Other long term (current) drug therapy: Secondary | ICD-10-CM | POA: Diagnosis not present

## 2022-04-10 DIAGNOSIS — R110 Nausea: Secondary | ICD-10-CM | POA: Diagnosis not present

## 2022-04-10 DIAGNOSIS — Z91040 Latex allergy status: Secondary | ICD-10-CM | POA: Insufficient documentation

## 2022-04-10 LAB — URINALYSIS, ROUTINE W REFLEX MICROSCOPIC
Bilirubin Urine: NEGATIVE
Glucose, UA: NEGATIVE mg/dL
Hgb urine dipstick: NEGATIVE
Ketones, ur: NEGATIVE mg/dL
Nitrite: NEGATIVE
Protein, ur: NEGATIVE mg/dL
Specific Gravity, Urine: 1.008 (ref 1.005–1.030)
pH: 6 (ref 5.0–8.0)

## 2022-04-10 LAB — COMPREHENSIVE METABOLIC PANEL
ALT: 24 U/L (ref 0–44)
AST: 18 U/L (ref 15–41)
Albumin: 3.9 g/dL (ref 3.5–5.0)
Alkaline Phosphatase: 101 U/L (ref 38–126)
Anion gap: 9 (ref 5–15)
BUN: 15 mg/dL (ref 6–20)
CO2: 23 mmol/L (ref 22–32)
Calcium: 9.1 mg/dL (ref 8.9–10.3)
Chloride: 106 mmol/L (ref 98–111)
Creatinine, Ser: 0.72 mg/dL (ref 0.44–1.00)
GFR, Estimated: >60 mL/min (ref >60)
Glucose, Bld: 97 mg/dL (ref 70–99)
Potassium: 3.9 mmol/L (ref 3.5–5.1)
Sodium: 138 mmol/L (ref 135–145)
Total Bilirubin: 0.7 mg/dL (ref 0.3–1.2)
Total Protein: 7.3 g/dL (ref 6.5–8.1)

## 2022-04-10 LAB — CBC WITH DIFFERENTIAL/PLATELET
Abs Immature Granulocytes: 0.03 10*3/uL (ref 0.00–0.07)
Basophils Absolute: 0.1 10*3/uL (ref 0.0–0.1)
Basophils Relative: 1 %
Eosinophils Absolute: 0.2 10*3/uL (ref 0.0–0.5)
Eosinophils Relative: 3 %
HCT: 44.3 % (ref 36.0–46.0)
Hemoglobin: 14.2 g/dL (ref 12.0–15.0)
Immature Granulocytes: 0 %
Lymphocytes Relative: 29 %
Lymphs Abs: 2.4 10*3/uL (ref 0.7–4.0)
MCH: 29.2 pg (ref 26.0–34.0)
MCHC: 32.1 g/dL (ref 30.0–36.0)
MCV: 91 fL (ref 80.0–100.0)
Monocytes Absolute: 0.5 10*3/uL (ref 0.1–1.0)
Monocytes Relative: 6 %
Neutro Abs: 5.1 10*3/uL (ref 1.7–7.7)
Neutrophils Relative %: 61 %
Platelets: 370 10*3/uL (ref 150–400)
RBC: 4.87 MIL/uL (ref 3.87–5.11)
RDW: 13.9 % (ref 11.5–15.5)
WBC: 8.4 10*3/uL (ref 4.0–10.5)
nRBC: 0 % (ref 0.0–0.2)

## 2022-04-10 LAB — I-STAT BETA HCG BLOOD, ED (MC, WL, AP ONLY): I-stat hCG, quantitative: <5 m[IU]/mL (ref <5)

## 2022-04-10 LAB — LIPASE, BLOOD: Lipase: 31 U/L (ref 11–51)

## 2022-04-10 MED ORDER — PREDNISONE 20 MG PO TABS: ORAL_TABLET | ORAL | 0 refills | Status: AC

## 2022-04-10 MED ORDER — HYDROMORPHONE HCL 1 MG/ML IJ SOLN
1.0000 mg | Freq: Once | INTRAMUSCULAR | Status: AC
  Administered 2022-04-10: 1 mg via INTRAMUSCULAR
  Filled 2022-04-10: qty 1

## 2022-04-10 MED ORDER — ONDANSETRON 8 MG PO TBDP: 8.0000 mg | ORAL_TABLET | Freq: Three times a day (TID) | ORAL | 0 refills | Status: AC | PRN

## 2022-04-10 MED ORDER — ONDANSETRON 4 MG PO TBDP
8.0000 mg | ORAL_TABLET | Freq: Once | ORAL | Status: AC
  Administered 2022-04-10: 8 mg via ORAL
  Filled 2022-04-10: qty 2

## 2022-04-10 NOTE — Discharge Instructions (Addendum)
It was our pleasure to provide your ER care today - we hope that you feel better. ? ?Overall, your lab and imaging results look good. ? ?Drink plenty of fluids.  Take prednisone as prescribed. Take your pain med (ultram) as need. Take zofran as need for nausea.  ? ?Take your pain medication as need.  ? ?Follow up with primary care doctor in the coming week. Also have your blood pressure rechecked then, as it is high today. ? ?Return to ER if worse, new symptoms, fevers, persistent vomiting, new, worsening or severe pain, or other concern. ? ?You were given pain meds in the ER - no driving for the next 6 hours.  ?

## 2022-04-10 NOTE — ED Triage Notes (Addendum)
Pt brought to ED triage by [facility] with c/o abdominal pain and nausea/diarrhea since approximately 0100.  ? ?EMS Vitals ?BP 135/94 ?HR 70 ?RR 15 ?SPO2 97 ?CBG 95 ? ? ?

## 2022-04-10 NOTE — ED Provider Triage Note (Signed)
Emergency Medicine Provider Triage Evaluation Note ? ?Bethany Chandler , a 41 y.o. female  was evaluated in triage.  Pt complains of abdominal cramping, N/V/D since 1am.  Seen for similar recently and found to be bradycardic during that visit.  CBG 95.  Denies feeling dizzy/lightheaded. ? ?Review of Systems  ?Positive: Cramping, N/V/D ?Negative: fever ? ?Physical Exam  ?BP (!) 146/92   Pulse 86   Temp 98.7 ?F (37.1 ?C) (Oral)   Resp 18   SpO2 94%  ? ?Gen:   Awake, no distress   ?Resp:  Normal effort  ?MSK:   Moves extremities without difficulty  ?Other:   ? ?Medical Decision Making  ?Medically screening exam initiated at 3:55 AM.  Appropriate orders placed.  Barbarann Carroll Sage was informed that the remainder of the evaluation will be completed by another provider, this initial triage assessment does not replace that evaluation, and the importance of remaining in the ED until their evaluation is complete. ? ?Abdominal cramping, N/V/D.  Recent visit 04/05/22 for same.  Normal CTA chest/abdomen/pelvis during that visit.  VSS in triage.  Will check labs. ?  ?Garlon Hatchet, PA-C ?04/10/22 0410 ? ?

## 2022-04-10 NOTE — ED Provider Notes (Signed)
?[facility] EMERGENCY DEPARTMENT ?Provider Note ? ? ?CSN: [id number] ?Arrival date & time: 04/10/22  0354 ? ?  ? ?History ? ?Chief Complaint  ?Patient presents with  ? Abdominal Pain  ? ? ?Bethany Chandler is a 41 y.o. female. ? ?Patient c/o mid back pain and upper abd pain, and nausea, diarrhea in the past several days. Symptoms acute onset, moderate, persistent, dull. No specific exacerbating or alleviating factors. No recent back injury or strain. Hx ddd. No radicular/leg pain. No numbness/weakness. No hematuria or dysuria. Hx kidney stones, but does not feel similar. No hx gallstones. No chest pain or sob. No fever or chills. Had ct for same symptoms recently.  ? ?The history is provided by the patient, medical records and the EMS personnel.  ?Abdominal Pain ?Associated symptoms: diarrhea and nausea   ?Associated symptoms: no chest pain, no chills, no cough, no dysuria, no fever, no hematuria, no shortness of breath and no vomiting   ? ?  ? ?Home Medications ?Prior to Admission medications   ?Medication Sig Start Date End Date Taking? Authorizing Provider  ?acetaminophen (TYLENOL) 500 MG tablet Take 500 mg by mouth every 6 (six) hours as needed for moderate pain or headache.    [provider]  ?ALPRAZolam (XANAX XR) 3 MG 24 hr tablet Take 3 mg by mouth every morning. 03/23/22   [provider]  ?cetirizine (ZYRTEC) 10 MG tablet Take 10 mg by mouth at bedtime. 03/23/22   [provider]  ?cyclobenzaprine (FLEXERIL) 10 MG tablet Take 10 mg by mouth at bedtime. 03/23/22   [provider]  ?gabapentin (NEURONTIN) 300 MG capsule Take 300 mg by mouth 3 (three) times daily. 02/27/22   [provider]  ?levothyroxine (SYNTHROID) 100 MCG tablet Take 100 mcg by mouth daily. 02/27/22   [provider]  ?meloxicam (MOBIC) 15 MG tablet Take 1 tablet daily until stone passes. ?Patient not taking: Reported on 04/05/2022 10/31/19   Molpus, Jonny Ruiz, MD  ?ondansetron  (ZOFRAN ODT) 8 MG disintegrating tablet Take 1 tablet (8 mg total) by mouth every 8 (eight) hours as needed. ?Patient not taking: Reported on 04/05/2022 10/31/19   Molpus, John, MD  ?oxyCODONE-acetaminophen (PERCOCET) 10-325 MG tablet Take 1 tablet every 4 hours as needed for severe pain. ?Patient not taking: Reported on 04/05/2022 10/31/19   Molpus, John, MD  ?tamsulosin (FLOMAX) 0.4 MG CAPS capsule Take 1 capsule daily until stone passes. ?Patient not taking: Reported on 04/05/2022 10/31/19   Molpus, John, MD  ?telmisartan (MICARDIS) 40 MG tablet Take 40 mg by mouth at bedtime. 03/30/22   [provider]  ?Vitamin D, Ergocalciferol, (DRISDOL) 1.25 MG (50000 UNIT) CAPS capsule Take 50,000 Units by mouth every Sunday. 02/26/22   [provider]  ?   ? ?Allergies    ?Latex and Tramadol   ? ?Review of Systems   ?Review of Systems  ?Constitutional:  Negative for chills and fever.  ?Eyes:  Negative for redness.  ?Respiratory:  Negative for cough and shortness of breath.   ?Cardiovascular:  Negative for chest pain.  ?Gastrointestinal:  Positive for abdominal pain, diarrhea and nausea. Negative for vomiting.  ?Genitourinary:  Negative for dysuria and hematuria.  ?Musculoskeletal:  Positive for back pain.  ?Skin:  Negative for rash.  ?Neurological:  Negative for headaches.  ?Hematological:  Does not bruise/bleed easily.  ?Psychiatric/Behavioral:  Negative for confusion.   ? ?Physical Exam ?Updated Vital Signs ?BP (!) 148/91   Pulse 97   Temp  98 ?F (36.7 ?C) (Oral)   Resp 16   SpO2 98%  ?Physical Exam ?Vitals and nursing note reviewed.  ?Constitutional:   ?   Appearance: Normal appearance. She is well-developed.  ?HENT:  ?   Head: Atraumatic.  ?   Nose: Nose normal.  ?   Mouth/Throat:  ?   Mouth: Mucous membranes are moist.  ?Eyes:  ?   General: No scleral icterus. ?   Conjunctiva/sclera: Conjunctivae normal.  ?Neck:  ?   Trachea: No tracheal deviation.  ?Cardiovascular:  ?   Rate and Rhythm: Normal rate and  regular rhythm.  ?   Pulses: Normal pulses.  ?   Heart sounds: Normal heart sounds. No murmur heard. ?  No friction rub. No gallop.  ?Pulmonary:  ?   Effort: Pulmonary effort is normal. No respiratory distress.  ?   Breath sounds: Normal breath sounds.  ?Abdominal:  ?   General: Bowel sounds are normal. There is no distension.  ?   Palpations: Abdomen is soft. There is no mass.  ?   Tenderness: There is no abdominal tenderness. There is no guarding or rebound.  ?   Hernia: No hernia is present.  ?Genitourinary: ?   Comments: No cva tenderness.  ?Musculoskeletal:     ?   General: No swelling or tenderness.  ?   Cervical back: Normal range of motion and neck supple. No rigidity. No muscular tenderness.  ?Skin: ?   General: Skin is warm and dry.  ?   Findings: No rash.  ?Neurological:  ?   Mental Status: She is alert.  ?   Comments: Alert, speech normal.   ?Psychiatric:     ?   Mood and Affect: Mood normal.  ? ? ?ED Results / Procedures / Treatments   ?Labs ?(all labs ordered are listed, but only abnormal results are displayed) ?Results for orders placed or performed during the hospital encounter of 04/10/22  ?CBC with Differential  ?Result Value Ref Range  ? WBC 8.4 4.0 - 10.5 K/uL  ? RBC 4.87 3.87 - 5.11 MIL/uL  ? Hemoglobin 14.2 12.0 - 15.0 g/dL  ? HCT 44.3 36.0 - 46.0 %  ? MCV 91.0 80.0 - 100.0 fL  ? MCH 29.2 26.0 - 34.0 pg  ? MCHC 32.1 30.0 - 36.0 g/dL  ? RDW 13.9 11.5 - 15.5 %  ? Platelets 370 150 - 400 K/uL  ? nRBC 0.0 0.0 - 0.2 %  ? Neutrophils Relative % 61 %  ? Neutro Abs 5.1 1.7 - 7.7 K/uL  ? Lymphocytes Relative 29 %  ? Lymphs Abs 2.4 0.7 - 4.0 K/uL  ? Monocytes Relative 6 %  ? Monocytes Absolute 0.5 0.1 - 1.0 K/uL  ? Eosinophils Relative 3 %  ? Eosinophils Absolute 0.2 0.0 - 0.5 K/uL  ? Basophils Relative 1 %  ? Basophils Absolute 0.1 0.0 - 0.1 K/uL  ? Immature Granulocytes 0 %  ? Abs Immature Granulocytes 0.03 0.00 - 0.07 K/uL  ?Comprehensive metabolic panel  ?Result Value Ref Range  ? Sodium 138 135 -  145 mmol/L  ? Potassium 3.9 3.5 - 5.1 mmol/L  ? Chloride 106 98 - 111 mmol/L  ? CO2 23 22 - 32 mmol/L  ? Glucose, Bld 97 70 - 99 mg/dL  ? BUN 15 6 - 20 mg/dL  ? Creatinine, Ser 0.72 0.44 - 1.00 mg/dL  ? Calcium 9.1 8.9 - 10.3 mg/dL  ? Total Protein 7.3 6.5 - 8.1 g/dL  ?  Albumin 3.9 3.5 - 5.0 g/dL  ? AST 18 15 - 41 U/L  ? ALT 24 0 - 44 U/L  ? Alkaline Phosphatase 101 38 - 126 U/L  ? Total Bilirubin 0.7 0.3 - 1.2 mg/dL  ? GFR, Estimated >60 >60 mL/min  ? Anion gap 9 5 - 15  ?Lipase, blood  ?Result Value Ref Range  ? Lipase 31 11 - 51 U/L  ?Urinalysis, Routine w reflex microscopic  ?Result Value Ref Range  ? Color, Urine STRAW (A) YELLOW  ? APPearance CLEAR CLEAR  ? Specific Gravity, Urine 1.008 1.005 - 1.030  ? pH 6.0 5.0 - 8.0  ? Glucose, UA NEGATIVE NEGATIVE mg/dL  ? Hgb urine dipstick NEGATIVE NEGATIVE  ? Bilirubin Urine NEGATIVE NEGATIVE  ? Ketones, ur NEGATIVE NEGATIVE mg/dL  ? Protein, ur NEGATIVE NEGATIVE mg/dL  ? Nitrite NEGATIVE NEGATIVE  ? Leukocytes,Ua TRACE (A) NEGATIVE  ? RBC / HPF 0-5 0 - 5 RBC/hpf  ? WBC, UA 0-5 0 - 5 WBC/hpf  ? Bacteria, UA RARE (A) NONE SEEN  ? Squamous Epithelial / LPF 0-5 0 - 5  ?I-Stat Beta hCG blood, ED ([facility], [facility], [facility] only)  ?Result Value Ref Range  ? I-stat hCG, quantitative <5.0 <5 mIU/mL  ? Comment 3          ? ?DG Skull 1-3 Views ? ?Result Date: 04/05/2022 ?CLINICAL DATA:  Evaluate for shunt malfunction. EXAM: SKULL - 1-3 VIEW COMPARISON:  06/27/2020 FINDINGS: Shunt tubing enters the frontal bone on the right and terminates to the left of midline. The shunt tubing courses over the scalp and cervical region on the right the shunt tubing compares continuous and without evidence of fracture. The calvarium is otherwise intact. The paranasal sinuses are clear. IMPRESSION: Right ventriculoperitoneal shunt. There is no evidence of tubing fracture or discontinuity in the calvarium, scalp, or cervical region on the right. Electronically Signed   By: Thornell SartoriusLaura  Taylor M.D.   On: 04/05/2022  04:24  ? ?DG Chest 2 View ? ?Result Date: 03/28/2022 ?CLINICAL DATA:  Chest tightness. EXAM: CHEST - 2 VIEW COMPARISON:  Chest x-ray 06/28/2020. FINDINGS: Loop recorder device overlies the heart, unchanged. The heart si

## 2022-05-16 ENCOUNTER — Emergency Department (HOSPITAL_COMMUNITY): Payer: 59

## 2022-05-16 ENCOUNTER — Other Ambulatory Visit: Payer: Self-pay

## 2022-05-16 ENCOUNTER — Emergency Department (HOSPITAL_COMMUNITY)
Admission: EM | Admit: 2022-05-16 | Discharge: 2022-05-16 | Discharge status: Against Medical Advice | Payer: 59 | Attending: Emergency Medicine | Admitting: Emergency Medicine

## 2022-05-16 ENCOUNTER — Encounter (HOSPITAL_COMMUNITY): Payer: Self-pay

## 2022-05-16 DIAGNOSIS — Z5321 Procedure and treatment not carried out due to patient leaving prior to being seen by health care provider: Secondary | ICD-10-CM | POA: Insufficient documentation

## 2022-05-16 DIAGNOSIS — R110 Nausea: Secondary | ICD-10-CM | POA: Insufficient documentation

## 2022-05-16 DIAGNOSIS — R0789 Other chest pain: Secondary | ICD-10-CM | POA: Insufficient documentation

## 2022-05-16 DIAGNOSIS — R55 Syncope and collapse: Secondary | ICD-10-CM | POA: Insufficient documentation

## 2022-05-16 DIAGNOSIS — R42 Dizziness and giddiness: Secondary | ICD-10-CM | POA: Insufficient documentation

## 2022-05-16 LAB — COMPREHENSIVE METABOLIC PANEL
ALT: 26 U/L (ref 0–44)
AST: 20 U/L (ref 15–41)
Albumin: 4.1 g/dL (ref 3.5–5.0)
Alkaline Phosphatase: 95 U/L (ref 38–126)
Anion gap: 11 (ref 5–15)
BUN: 15 mg/dL (ref 6–20)
CO2: 23 mmol/L (ref 22–32)
Calcium: 8.6 mg/dL — ABNORMAL LOW (ref 8.9–10.3)
Chloride: 107 mmol/L (ref 98–111)
Creatinine, Ser: 0.92 mg/dL (ref 0.44–1.00)
GFR, Estimated: >60 mL/min (ref >60)
Glucose, Bld: 82 mg/dL (ref 70–99)
Potassium: 4 mmol/L (ref 3.5–5.1)
Sodium: 141 mmol/L (ref 135–145)
Total Bilirubin: 0.8 mg/dL (ref 0.3–1.2)
Total Protein: 7.2 g/dL (ref 6.5–8.1)

## 2022-05-16 LAB — CBC WITH DIFFERENTIAL/PLATELET
Abs Immature Granulocytes: 0.03 10*3/uL (ref 0.00–0.07)
Basophils Absolute: 0.1 10*3/uL (ref 0.0–0.1)
Basophils Relative: 1 %
Eosinophils Absolute: 0.2 10*3/uL (ref 0.0–0.5)
Eosinophils Relative: 2 %
HCT: 42.8 % (ref 36.0–46.0)
Hemoglobin: 14.1 g/dL (ref 12.0–15.0)
Immature Granulocytes: 0 %
Lymphocytes Relative: 29 %
Lymphs Abs: 2.8 10*3/uL (ref 0.7–4.0)
MCH: 29.7 pg (ref 26.0–34.0)
MCHC: 32.9 g/dL (ref 30.0–36.0)
MCV: 90.1 fL (ref 80.0–100.0)
Monocytes Absolute: 0.8 10*3/uL (ref 0.1–1.0)
Monocytes Relative: 8 %
Neutro Abs: 5.6 10*3/uL (ref 1.7–7.7)
Neutrophils Relative %: 60 %
Platelets: 370 10*3/uL (ref 150–400)
RBC: 4.75 MIL/uL (ref 3.87–5.11)
RDW: 13.7 % (ref 11.5–15.5)
WBC: 9.5 10*3/uL (ref 4.0–10.5)
nRBC: 0 % (ref 0.0–0.2)

## 2022-05-16 LAB — TROPONIN I (HIGH SENSITIVITY): Troponin I (High Sensitivity): 6 ng/L (ref <18)

## 2022-05-16 LAB — I-STAT BETA HCG BLOOD, ED (MC, WL, AP ONLY): I-stat hCG, quantitative: <5 m[IU]/mL (ref <5)

## 2022-05-16 NOTE — ED Notes (Signed)
EMS IV removed by this Clinical research associate. Patient states if she has to wait she is leaving. Requested IV out and on the phone trying to find a ride at this time

## 2022-05-16 NOTE — ED Provider Triage Note (Signed)
Emergency Medicine Provider Triage Evaluation Note  Bethany Chandler , a 41 y.o. female  was evaluated in triage.  Pt complains of near syncope.  Patient reports that she had just sat down after doing some housework when she became lightheaded, nauseated, and folic she was going to pass out.  She has some chest tightness with this.  She states that she had a history of similar in the past multiple prior occasions.  She received Zofran with EMS with some improvement in nausea..  Review of Systems  Per above.   Physical Exam  BP (!) 151/100 (BP Location: Left Arm)   Pulse 88   Temp 98 F (36.7 C) (Oral)   Resp 16   Ht 5\' 6"  (1.676 m)   Wt (!) 147.4 kg   SpO2 99%   BMI 52.46 kg/m  Gen:   Awake, no distress   Resp:  Normal effort  MSK:   Moves extremities without difficulty   Medical Decision Making  Medically screening exam initiated at 2:13 AM.  Appropriate orders placed.  Denece was informed that the remainder of the evaluation will be completed by another provider, this initial triage assessment does not replace that evaluation, and the importance of remaining in the ED until their evaluation is complete.  Near syncope.    Carroll Sage, Cherly Anderson 05/16/22 [phone]

## 2022-05-16 NOTE — ED Triage Notes (Signed)
PER EMS: pt is from home with c/o near syncopal episode, tingling to her hands and feet, head felt "swimmy" and felt nauseous tonight, symptom onset tonight at 11pm. BP-160/100, HR- 90, CBG-150  20g RAC, 4mg  zofran given en route

## 2022-07-26 ENCOUNTER — Encounter (HOSPITAL_BASED_OUTPATIENT_CLINIC_OR_DEPARTMENT_OTHER): Payer: Self-pay

## 2022-07-26 ENCOUNTER — Emergency Department (HOSPITAL_BASED_OUTPATIENT_CLINIC_OR_DEPARTMENT_OTHER): Payer: 59

## 2022-07-26 ENCOUNTER — Other Ambulatory Visit: Payer: Self-pay

## 2022-07-26 ENCOUNTER — Emergency Department (HOSPITAL_BASED_OUTPATIENT_CLINIC_OR_DEPARTMENT_OTHER)
Admission: EM | Admit: 2022-07-26 | Discharge: 2022-07-26 | Disposition: A | Discharge status: Home / Self Care | Payer: 59 | Attending: Emergency Medicine | Admitting: Emergency Medicine

## 2022-07-26 DIAGNOSIS — R000 Tachycardia, unspecified: Secondary | ICD-10-CM | POA: Insufficient documentation

## 2022-07-26 DIAGNOSIS — R1012 Left upper quadrant pain: Secondary | ICD-10-CM | POA: Diagnosis present

## 2022-07-26 DIAGNOSIS — N200 Calculus of kidney: Secondary | ICD-10-CM | POA: Diagnosis not present

## 2022-07-26 DIAGNOSIS — N390 Urinary tract infection, site not specified: Secondary | ICD-10-CM | POA: Diagnosis not present

## 2022-07-26 DIAGNOSIS — Z91040 Latex allergy status: Secondary | ICD-10-CM | POA: Diagnosis not present

## 2022-07-26 LAB — HEPATIC FUNCTION PANEL
ALT: 26 U/L (ref 0–44)
AST: 22 U/L (ref 15–41)
Albumin: 4.3 g/dL (ref 3.5–5.0)
Alkaline Phosphatase: 100 U/L (ref 38–126)
Bilirubin, Direct: 0.1 mg/dL (ref 0.0–0.2)
Indirect Bilirubin: 1 mg/dL — ABNORMAL HIGH (ref 0.3–0.9)
Total Bilirubin: 1.1 mg/dL (ref 0.3–1.2)
Total Protein: 8 g/dL (ref 6.5–8.1)

## 2022-07-26 LAB — URINALYSIS, ROUTINE W REFLEX MICROSCOPIC
Bilirubin Urine: NEGATIVE
Glucose, UA: NEGATIVE mg/dL
Hgb urine dipstick: NEGATIVE
Ketones, ur: NEGATIVE mg/dL
Nitrite: NEGATIVE
Protein, ur: NEGATIVE mg/dL
Specific Gravity, Urine: 1.01 (ref 1.005–1.030)
pH: 5 (ref 5.0–8.0)

## 2022-07-26 LAB — BASIC METABOLIC PANEL
Anion gap: 9 (ref 5–15)
BUN: 15 mg/dL (ref 6–20)
CO2: 22 mmol/L (ref 22–32)
Calcium: 8.8 mg/dL — ABNORMAL LOW (ref 8.9–10.3)
Chloride: 107 mmol/L (ref 98–111)
Creatinine, Ser: 0.83 mg/dL (ref 0.44–1.00)
GFR, Estimated: >60 mL/min (ref >60)
Glucose, Bld: 128 mg/dL — ABNORMAL HIGH (ref 70–99)
Potassium: 4.1 mmol/L (ref 3.5–5.1)
Sodium: 138 mmol/L (ref 135–145)

## 2022-07-26 LAB — CBC
HCT: 42.7 % (ref 36.0–46.0)
Hemoglobin: 14.4 g/dL (ref 12.0–15.0)
MCH: 29.5 pg (ref 26.0–34.0)
MCHC: 33.7 g/dL (ref 30.0–36.0)
MCV: 87.5 fL (ref 80.0–100.0)
Platelets: 386 10*3/uL (ref 150–400)
RBC: 4.88 MIL/uL (ref 3.87–5.11)
RDW: 13.6 % (ref 11.5–15.5)
WBC: 8.1 10*3/uL (ref 4.0–10.5)
nRBC: 0 % (ref 0.0–0.2)

## 2022-07-26 LAB — LIPASE, BLOOD: Lipase: 29 U/L (ref 11–51)

## 2022-07-26 LAB — URINALYSIS, MICROSCOPIC (REFLEX): RBC / HPF: NONE SEEN RBC/hpf (ref 0–5)

## 2022-07-26 LAB — PREGNANCY, URINE: Preg Test, Ur: NEGATIVE

## 2022-07-26 MED ORDER — ONDANSETRON HCL 4 MG/2ML IJ SOLN
4.0000 mg | Freq: Once | INTRAMUSCULAR | Status: AC
Start: 2022-07-26 — End: 2022-07-26
  Administered 2022-07-26: 4 mg via INTRAVENOUS
  Filled 2022-07-26: qty 2

## 2022-07-26 MED ORDER — CEFDINIR 300 MG PO CAPS: 300.0000 mg | ORAL_CAPSULE | Freq: Two times a day (BID) | ORAL | 0 refills | Status: AC

## 2022-07-26 MED ORDER — OXYCODONE-ACETAMINOPHEN 5-325 MG PO TABS: 1.0000 | ORAL_TABLET | Freq: Four times a day (QID) | ORAL | 0 refills | Status: AC | PRN

## 2022-07-26 MED ORDER — NITROFURANTOIN MONOHYD MACRO 100 MG PO CAPS: 100.0000 mg | ORAL_CAPSULE | Freq: Two times a day (BID) | ORAL | 0 refills | Status: DC

## 2022-07-26 MED ORDER — ONDANSETRON HCL 4 MG/2ML IJ SOLN
4.0000 mg | Freq: Once | INTRAMUSCULAR | Status: AC
  Administered 2022-07-26: 4 mg via INTRAVENOUS
  Filled 2022-07-26: qty 2

## 2022-07-26 MED ORDER — KETOROLAC TROMETHAMINE 30 MG/ML IJ SOLN
30.0000 mg | Freq: Once | INTRAMUSCULAR | Status: AC
  Administered 2022-07-26: 30 mg via INTRAVENOUS
  Filled 2022-07-26: qty 1

## 2022-07-26 MED ORDER — MORPHINE SULFATE (PF) 4 MG/ML IV SOLN
4.0000 mg | Freq: Once | INTRAVENOUS | Status: AC
  Administered 2022-07-26: 4 mg via INTRAVENOUS
  Filled 2022-07-26: qty 1

## 2022-07-26 MED ORDER — SODIUM CHLORIDE 0.9 % IV SOLN
1.0000 g | Freq: Once | INTRAVENOUS | Status: AC
  Administered 2022-07-26: 1 g via INTRAVENOUS
  Filled 2022-07-26: qty 10

## 2022-07-26 MED ORDER — HYDROMORPHONE HCL 1 MG/ML IJ SOLN
0.5000 mg | Freq: Once | INTRAMUSCULAR | Status: AC
  Administered 2022-07-26: 0.5 mg via INTRAVENOUS
  Filled 2022-07-26: qty 1

## 2022-07-26 NOTE — ED Notes (Signed)
Pt d/c home with mother per MD order. Discharge summary reviewed, pt verbalizes understanding. Ambulatory off unit. Reports mother is discharge ride home. No s/s of acute distress noted.

## 2022-07-26 NOTE — Discharge Instructions (Addendum)
Please read and follow all provided instructions.  Your diagnoses today include:  1. Urinary tract infection without hematuria, site unspecified   2. Renal stone     Tests performed today include: Urine test that showed some infection fighting cells CT scan which showed no stones blocking the kidney but a small stone inside the left kidney with inflammation around the kidney Blood test that showed normal kidney function Vital signs. See below for your results today.   Medications prescribed:  Oxycodone - narcotic pain medication  DO NOT drive or perform any activities that require you to be awake and alert because this medicine can make you drowsy.   Cefdinir - antibiotic  Take any prescribed medications only as directed.  Home care instructions:  Follow any educational materials contained in this packet.  Follow-up instructions: Please follow-up with your primary care provider in the next 3 days for recheck of your symptoms.   Return instructions:  Please return to the Emergency Department if you experience worsening symptoms.  Please return if you develop fever or uncontrolled pain or vomiting. Please return if you have any other emergent concerns.  Additional Information:  Your vital signs today were: BP (!) 146/112   Pulse 92   Temp 99.3 F (37.4 C) (Axillary)   Resp 16   Ht 5\' 6"  (1.676 m)   Wt (!) 147.4 kg   LMP 07/16/2022   SpO2 96%   BMI 52.45 kg/m  If your blood pressure (BP) was elevated above 135/85 this visit, please have this repeated by your doctor within one month. --------------

## 2022-07-26 NOTE — ED Triage Notes (Signed)
Patient c/o flank pain and states left side is worse. Is actively working to pass a stone. C/o pain with urination.

## 2022-07-26 NOTE — ED Provider Notes (Signed)
[facility] EMERGENCY DEPARTMENT Provider Note   CSN: [id number] Arrival date & time: 07/26/22  1454     History  Chief Complaint  Patient presents with   Flank Pain    Bethany Chandler is a 41 y.o. female with MDD, ADD, pseudotumor cerebri status post VP shunt placement, and obesity presenting with flank pain.  Pain is located in the left upper abdomen extends to the flank and around the top of the abdomen.  Denies chest pain shortness of breath.  Pain started all of a sudden this morning at 6 AM.  Pain is sharp and "feels like labor pains".  Patient stated that yesterday she was certain that she passed a kidney stone.  Endorses nausea but no fever, no vomiting and no diarrhea.  Endorses painful and burning urination and malodorous urine.   Flank Pain       Home Medications Prior to Admission medications   Medication Sig Start Date End Date Taking? Authorizing Provider  acetaminophen (TYLENOL) 500 MG tablet Take 500 mg by mouth every 6 (six) hours as needed for moderate pain or headache.    [provider]  ALPRAZolam (XANAX XR) 3 MG 24 hr tablet Take 3 mg by mouth every morning. 03/23/22   [provider]  cetirizine (ZYRTEC) 10 MG tablet Take 10 mg by mouth at bedtime. 03/23/22   [provider]  cyclobenzaprine (FLEXERIL) 10 MG tablet Take 10 mg by mouth at bedtime. 03/23/22   [provider]  gabapentin (NEURONTIN) 300 MG capsule Take 300 mg by mouth 3 (three) times daily. 02/27/22   [provider]  levothyroxine (SYNTHROID) 100 MCG tablet Take 100 mcg by mouth daily. 02/27/22   [provider]  meloxicam (MOBIC) 15 MG tablet Take 1 tablet daily until stone passes. Patient not taking: Reported on 04/05/2022 10/31/19   Molpus, Jonny Ruiz, MD  ondansetron (ZOFRAN-ODT) 8 MG disintegrating tablet Take 1 tablet (8 mg total) by mouth every 8 (eight) hours as needed for nausea or vomiting. 04/10/22   Cathren Laine, MD   oxyCODONE-acetaminophen (PERCOCET) 10-325 MG tablet Take 1 tablet every 4 hours as needed for severe pain. Patient not taking: Reported on 04/05/2022 10/31/19   Molpus, Jonny Ruiz, MD  predniSONE (DELTASONE) 20 MG tablet 3 po once a day for 2 days, then 2 po once a day for 3 days, then 1 po once a day for 3 days 04/10/22   Cathren Laine, MD  tamsulosin (FLOMAX) 0.4 MG CAPS capsule Take 1 capsule daily until stone passes. Patient not taking: Reported on 04/05/2022 10/31/19   Molpus, Ching Rabideau, MD  telmisartan (MICARDIS) 40 MG tablet Take 40 mg by mouth at bedtime. 03/30/22   [provider]  Vitamin D, Ergocalciferol, (DRISDOL) 1.25 MG (50000 UNIT) CAPS capsule Take 50,000 Units by mouth every Sunday. 02/26/22   [provider]      Allergies    Latex    Review of Systems   Review of Systems  Genitourinary:  Positive for flank pain.    Physical Exam Updated Vital Signs BP 136/88   Pulse 100   Temp 98.5 F (36.9 C) (Oral)   Resp 13   Ht 5\' 6"  (1.676 m)   Wt (!) 147.4 kg   LMP 07/16/2022   SpO2 93%   BMI 52.45 kg/m  Physical Exam Vitals and nursing note reviewed.  Constitutional:      Appearance: She is obese.  HENT:     Head: Normocephalic and atraumatic.  Mouth/Throat:     Mouth: Mucous membranes are moist.  Eyes:     General:        Right eye: No discharge.        Left eye: No discharge.     Conjunctiva/sclera: Conjunctivae normal.  Cardiovascular:     Rate and Rhythm: Normal rate and regular rhythm.     Pulses: Normal pulses.     Heart sounds: Normal heart sounds.  Pulmonary:     Effort: Pulmonary effort is normal.     Breath sounds: Normal breath sounds.  Abdominal:     General: Abdomen is flat.     Palpations: Abdomen is soft.     Tenderness: There is generalized abdominal tenderness. There is right CVA tenderness and left CVA tenderness.  Skin:    General: Skin is warm and dry.  Neurological:     General: No focal deficit present.  Psychiatric:         Mood and Affect: Mood normal.     ED Results / Procedures / Treatments   Labs (all labs ordered are listed, but only abnormal results are displayed) Labs Reviewed  URINALYSIS, ROUTINE W REFLEX MICROSCOPIC - Abnormal; Notable for the following components:      Result Value   Leukocytes,Ua SMALL (*)    All other components within normal limits  BASIC METABOLIC PANEL - Abnormal; Notable for the following components:   Glucose, Bld 128 (*)    Calcium 8.8 (*)    All other components within normal limits  URINALYSIS, MICROSCOPIC (REFLEX) - Abnormal; Notable for the following components:   Bacteria, UA RARE (*)    All other components within normal limits  HEPATIC FUNCTION PANEL - Abnormal; Notable for the following components:   Indirect Bilirubin 1.0 (*)    All other components within normal limits  URINE CULTURE  PREGNANCY, URINE  CBC  LIPASE, BLOOD    EKG Revealed NSR  Radiology CT RENAL STONE STUDY  Result Date: 07/26/2022 CLINICAL DATA:  Flank pain, worse on the left. EXAM: CT ABDOMEN AND PELVIS WITHOUT CONTRAST TECHNIQUE: Multidetector CT imaging of the abdomen and pelvis was performed following the standard protocol without IV contrast. RADIATION DOSE REDUCTION: This exam was performed according to the departmental dose-optimization program which includes automated exposure control, adjustment of the mA and/or kV according to patient size and/or use of iterative reconstruction technique. COMPARISON:  CT Apr 05, 2022. FINDINGS: Lower chest: No acute abnormality. Hepatobiliary: Unremarkable noncontrast enhanced appearance of the liver and gallbladder. No biliary ductal dilation. Pancreas: No pancreatic ductal dilation or evidence of acute inflammation. Spleen: No splenomegaly. Adrenals/Urinary Tract: Bilateral adrenal glands appear normal. No hydronephrosis. Subtle edematous appearance and perinephric stranding along the right kidney. Punctate nonobstructive 1-2 mm left lower pole  renal stone. No obstructive ureteral or bladder calculi identified. Urinary bladder is unremarkable for degree of distension. Stomach/Bowel: No radiopaque enteric contrast material was administered. Stomach is minimally distended limiting evaluation. No pathologic dilation of small or large bowel. The appendix and terminal ileum appear normal. No evidence of acute bowel inflammation. Vascular/Lymphatic: Normal caliber abdominal aorta. No pathologically enlarged abdominal or pelvic lymph nodes. Reproductive: Uterus and bilateral adnexa are unremarkable. Other: No significant abdominopelvic free fluid. Ventriculoperitoneal shunt catheter tubing terminates in the anterior abdomen Musculoskeletal: No acute osseous abnormality. IMPRESSION: 1. Subtle edematous appearance and perinephric stranding along the right kidney may reflect ascending urinary tract infection or sequela of recently passed stone. 2. Punctate nonobstructive 1-2 mm left lower pole renal  stone. 3. No obstructive ureteral or bladder calculi identified. Electronically Signed   By: Maudry Mayhew M.D.   On: 07/26/2022 19:01    Procedures Procedures    Medications Ordered in ED Medications  cefTRIAXone (ROCEPHIN) 1 g in sodium chloride 0.9 % 100 mL IVPB (has no administration in time range)  morphine (PF) 4 MG/ML injection 4 mg (4 mg Intravenous Given 07/26/22 1801)  ondansetron (ZOFRAN) injection 4 mg (4 mg Intravenous Given 07/26/22 1800)  ketorolac (TORADOL) 30 MG/ML injection 30 mg (30 mg Intravenous Given 07/26/22 1855)    ED Course/ Medical Decision Making/ A&P                           Medical Decision Making Amount and/or Complexity of Data Reviewed Labs: ordered. Radiology: ordered.  Risk Prescription drug management.   This patient presents to the ED for concern of abdominal pain, this involves a number of treatment options, and is a complaint that carries with it a  risk of complications and morbidity.  The differential  diagnosis includes nephrolithiasis, pyelonephritis, pancreatitis, and ACS.   Co morbidities: Discussed in HPI   EMR reviewed including pt PMHx, past surgical history and past visits to ER.  Chart reviewed revealed recent unremarkable CT angio chest abdomen pelvis and unremarkable liver and gallbladder ultrasound.  See HPI for more details   Lab Tests:   I ordered and independently interpreted labs. Labs notable for bacteriuria and pyuria   Imaging Studies:  NAD. I personally reviewed all imaging studies and no acute abnormality found. I agree with radiology interpretation.    Cardiac Monitoring:  The patient was maintained on a cardiac monitor.  I personally viewed and interpreted the cardiac monitored which showed an underlying rhythm of: Sinus tachycardia EKG non-ischemic   Medicines ordered:  I ordered medication including Zofran for nausea morphine for pain.  Reevaluation of the patient after these medicines showed that the patient improved I have reviewed the patients home medicines and have made adjustments as needed      Reevaluation:  After the interventions noted above I re-evaluated patient and found that they have :improved   Problem List / ED Course: Presented for left flank pain and abdominal pain.  Patient endorsed history of kidney stones.  Due to the nature of her pain being abrupt in onset and located in the left flank warranted renal stone study.  Also kidney infection but unlikely given normal white blood cell count, afebrile and stable vitals.  CT scan revealed nonobstructive renal stone located in the renal pole and concerning for possible ascending UTI.    Also ordered Toradol for ongoing pain control.  Labs reveal concern for UTI along with patient endorsing painful urination and malodorous urine. Gave IV Rocephin. Signed out patient to oncoming APP for ongoing management.  Dispostion:  After consideration of the diagnostic results and the  patients response to treatment, I feel that the patient would benefit from discharge and treatment for ongoing UTI and follow-up with PCP.         Final Clinical Impression(s) / ED Diagnoses Final diagnoses:  Urinary tract infection without hematuria, site unspecified  Renal stone    Rx / DC Orders ED Discharge Orders          Ordered    nitrofurantoin, macrocrystal-monohydrate, (MACROBID) 100 MG capsule  2 times daily,   Status:  Discontinued        07/26/22 1824  Gareth Eagle, PA-C 07/26/22 1914    Melene Plan, DO 07/26/22 2118

## 2022-07-26 NOTE — ED Provider Notes (Signed)
Signout from [employer] at shift change. Briefly, patient presents for flank pain.  Patient reports passing a stone recently.   Plan: IV antibiotics, pain control, discharge when complete   8:26 PM Reassessment performed. Patient states pain and nausea returning. She had 30mg  IV toradol which helped earlier.   Labs and imaging personally reviewed and interpreted including: CBC unremarkable; BMP unremarkable; hepatic function panel unremarkable; lipase normal; UA with small leukocyte esterase and few white blood cells noted on microscopic, no red cells; pregnancy negative; CT abdomen and pelvis with intrarenal stone, some question of ascending UTI.   Most current vital signs reviewed and are as follows: BP 136/88   Pulse 100   Temp 99.3 F (37.4 C) (Axillary)   Resp 13   Ht 5\' 6"  (1.676 m)   Wt (!) 147.4 kg   LMP 07/16/2022   SpO2 93%   BMI 52.45 kg/m     Plan: Patient feeling better after additional pain medications.  She is requesting discharge.   Home treatment: Rest, hydration.  Prescription given for cefdinir, #6 Percocet for pain control at home, otherwise Tylenol/ibuprofen.   Encouraged return to ED with The patient was urged to return to the Emergency Department immediately with worsening of current symptoms, worsening abdominal pain, persistent vomiting, blood noted in stools, fever, or any other concerns. The patient verbalized understanding.   Patient to follow-up with their provider in 3 days. Patient verbalized understanding and agreed with plan.        , PA-C 07/26/22 2121    07/28/22, DO 07/26/22 2322

## 2022-07-27 LAB — URINE CULTURE: Culture: NO GROWTH

## 2023-02-27 IMAGING — US US ABDOMEN LIMITED
1 series · 14 of 25 positions shown · non-contrast
Comparison: CT scan 04/05/2022

CLINICAL DATA: Abdominal pain

EXAM:
ULTRASOUND ABDOMEN LIMITED RIGHT UPPER QUADRANT

[Series 1: us abdomen limited ruq (liver/gb) · 14 of 59 slices shown]
[im 1/59]
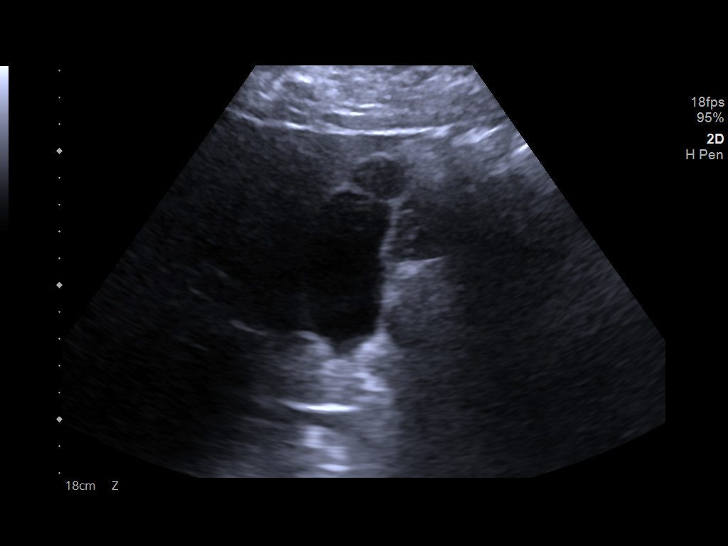
[im 5/59]
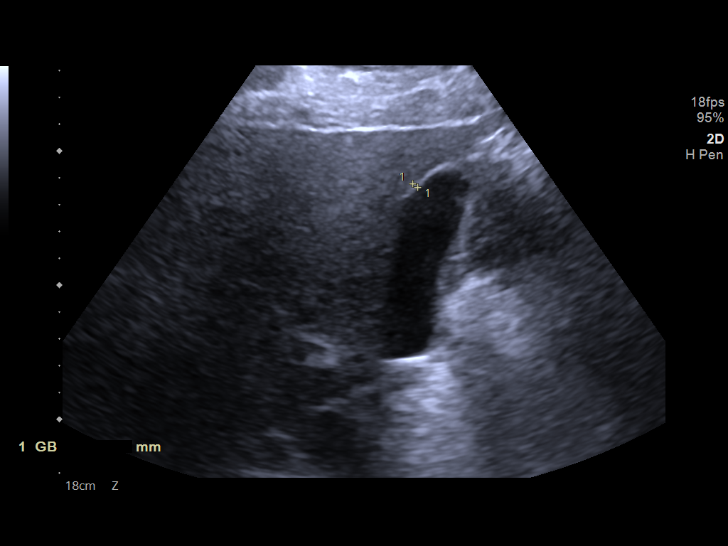
[im 10/59]
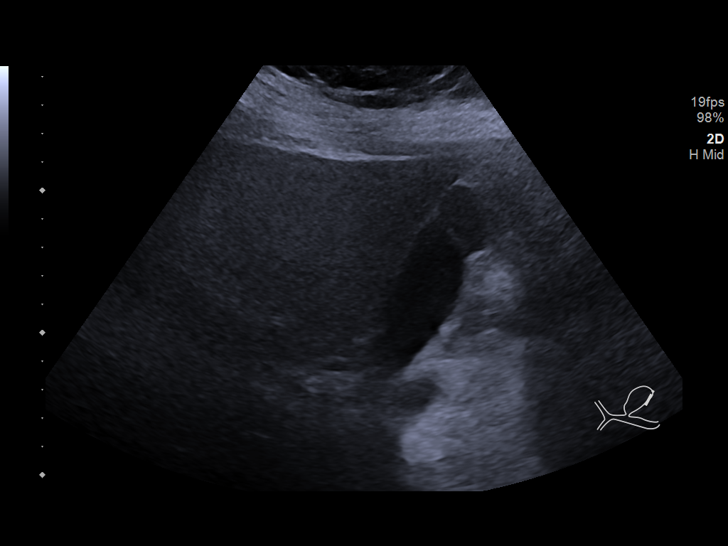
[im 15/59]
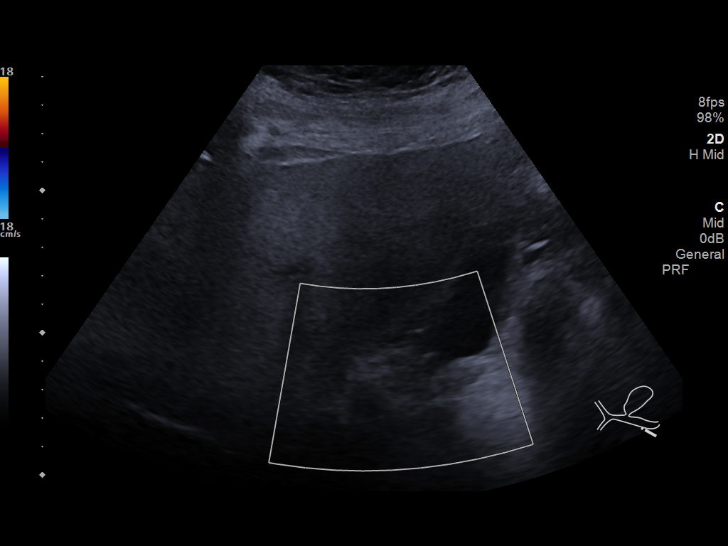
[im 20/59]
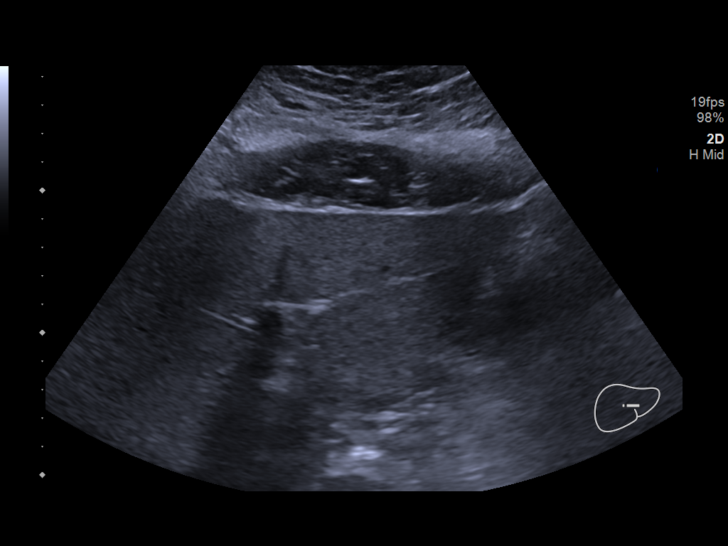
[im 22/59]
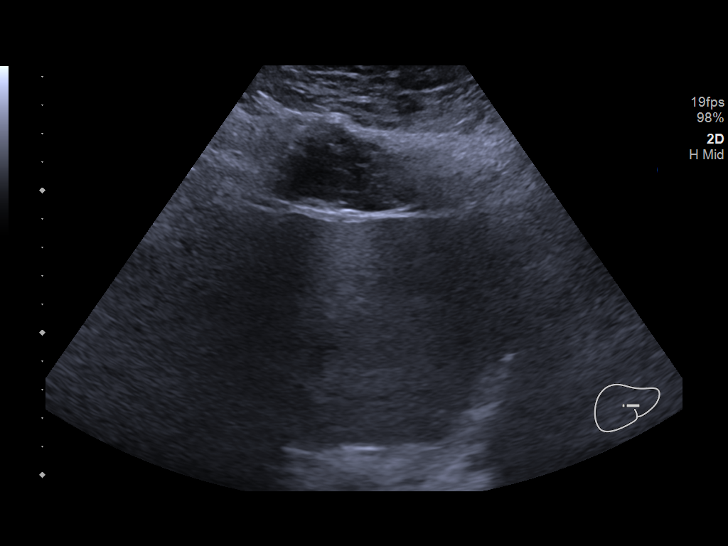
[im 27/59]
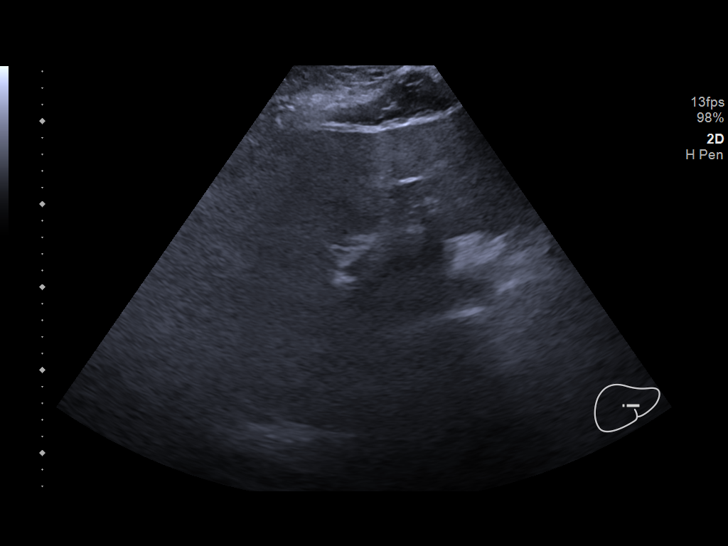
[im 32/59]
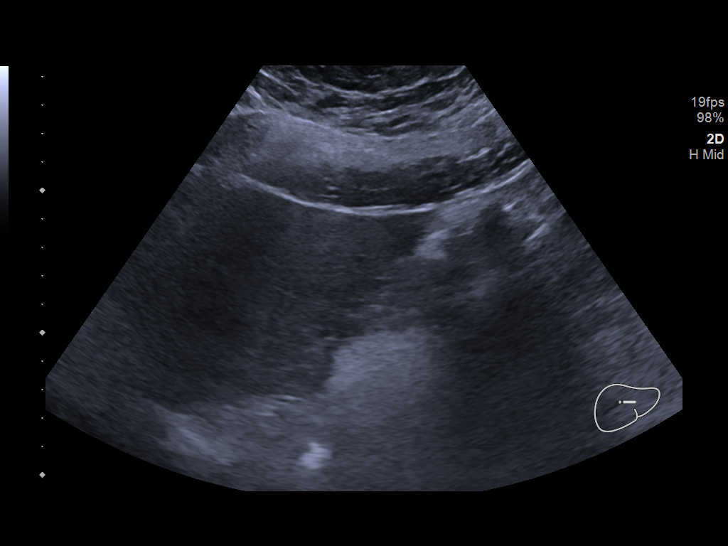
[im 37/59]
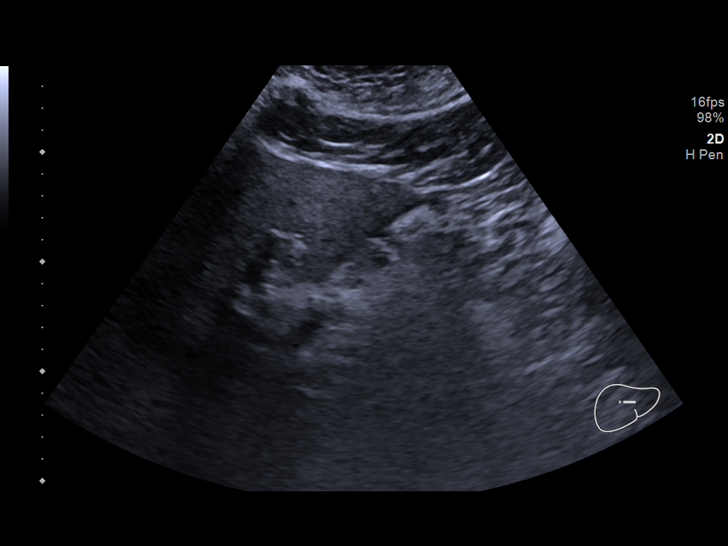
[im 39/59]
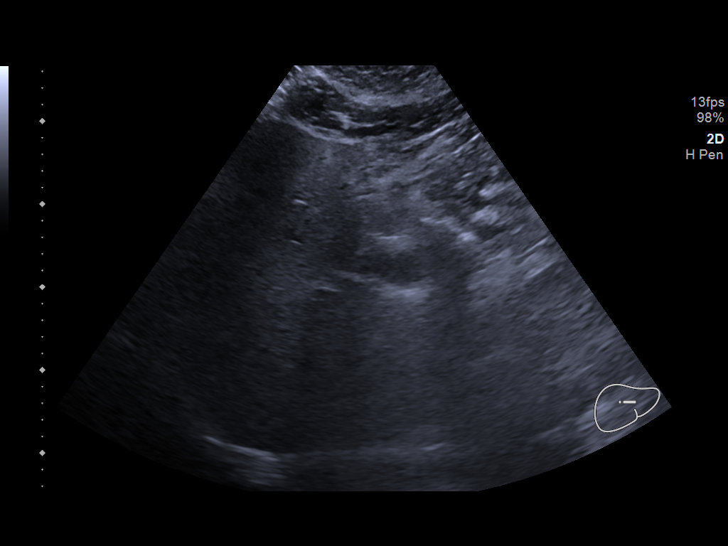
[im 44/59]
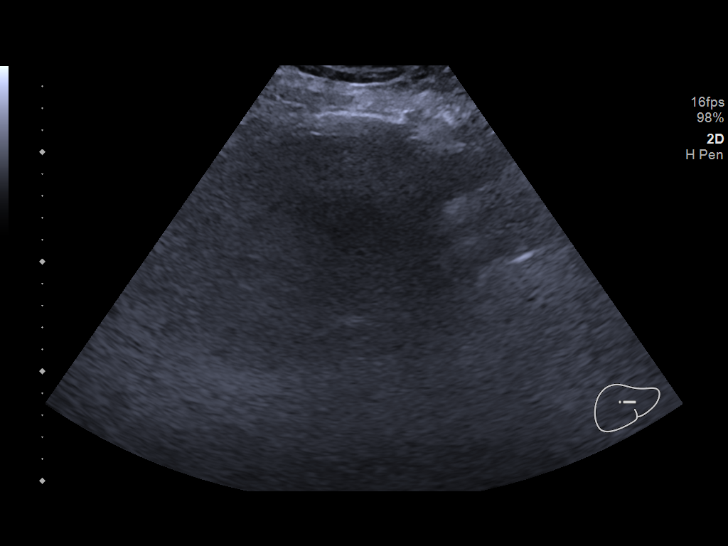
[im 49/59]
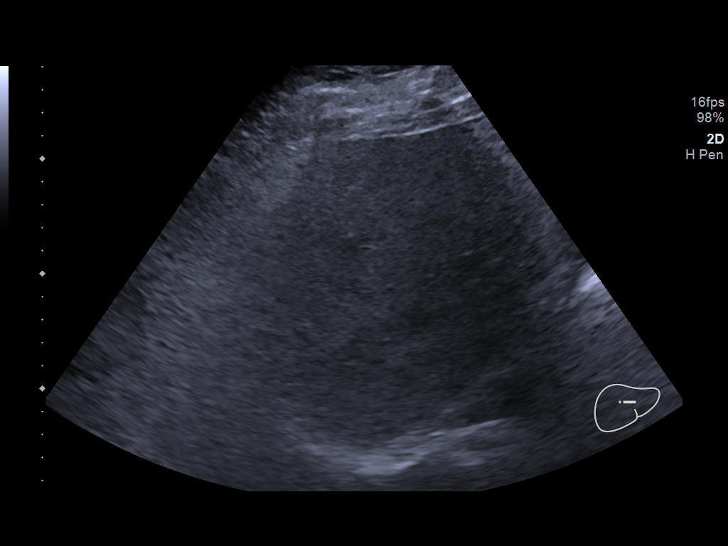
[im 54/59]
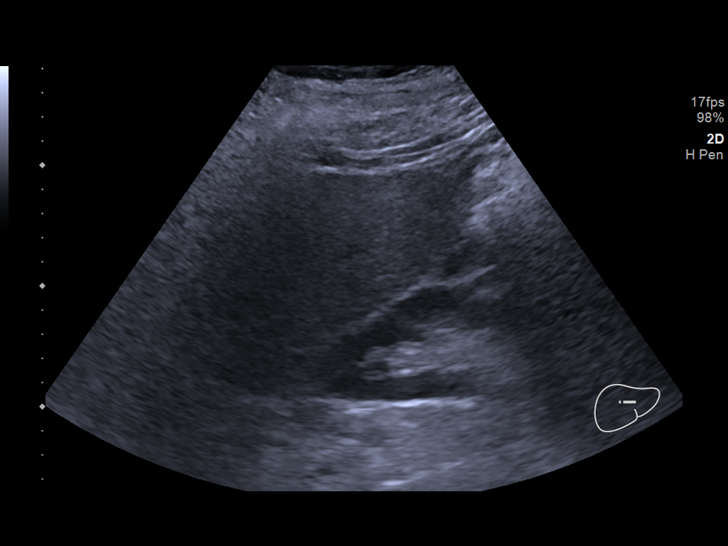
[im 59/59]
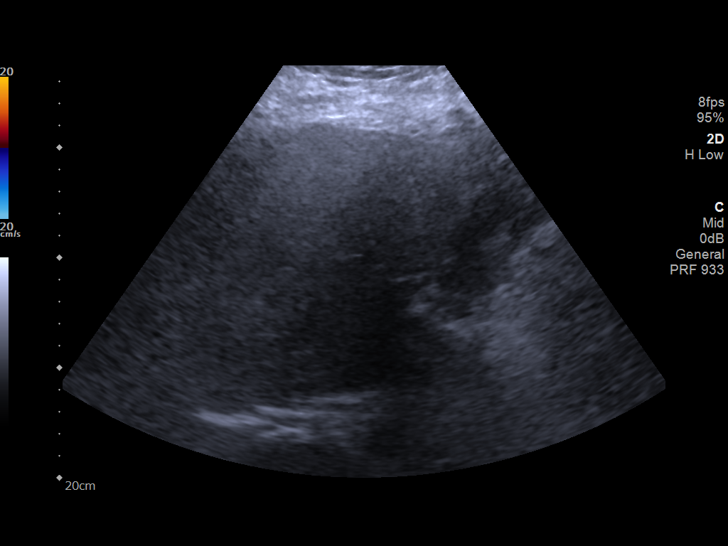

[14 of 25 positions shown; findings below may reference images not displayed]

FINDINGS: Gallbladder:

No gallstones or wall thickening visualized. No sonographic Murphy
sign noted by sonographer.

Common bile duct:

Diameter: 0.5 cm

Liver:

No focal lesion identified. Coarse echogenic liver with poor sonic
penetration compatible with diffuse hepatic steatosis. Portal vein
is patent on color Doppler imaging with normal direction of blood
flow towards the liver.

Other: Mildly limited sensitivity due to body habitus.
IMPRESSION: 1. Coarse echogenic liver with poor sonic penetration compatible
with diffuse hepatic steatosis.

## 2023-04-04 IMAGING — DX DG CHEST 2V
2 series · 2 of 2 positions shown · non-contrast
Comparison: 04/05/2022.

CLINICAL DATA: Chest pain.

EXAM:
CHEST - 2 VIEW

[chest lat]
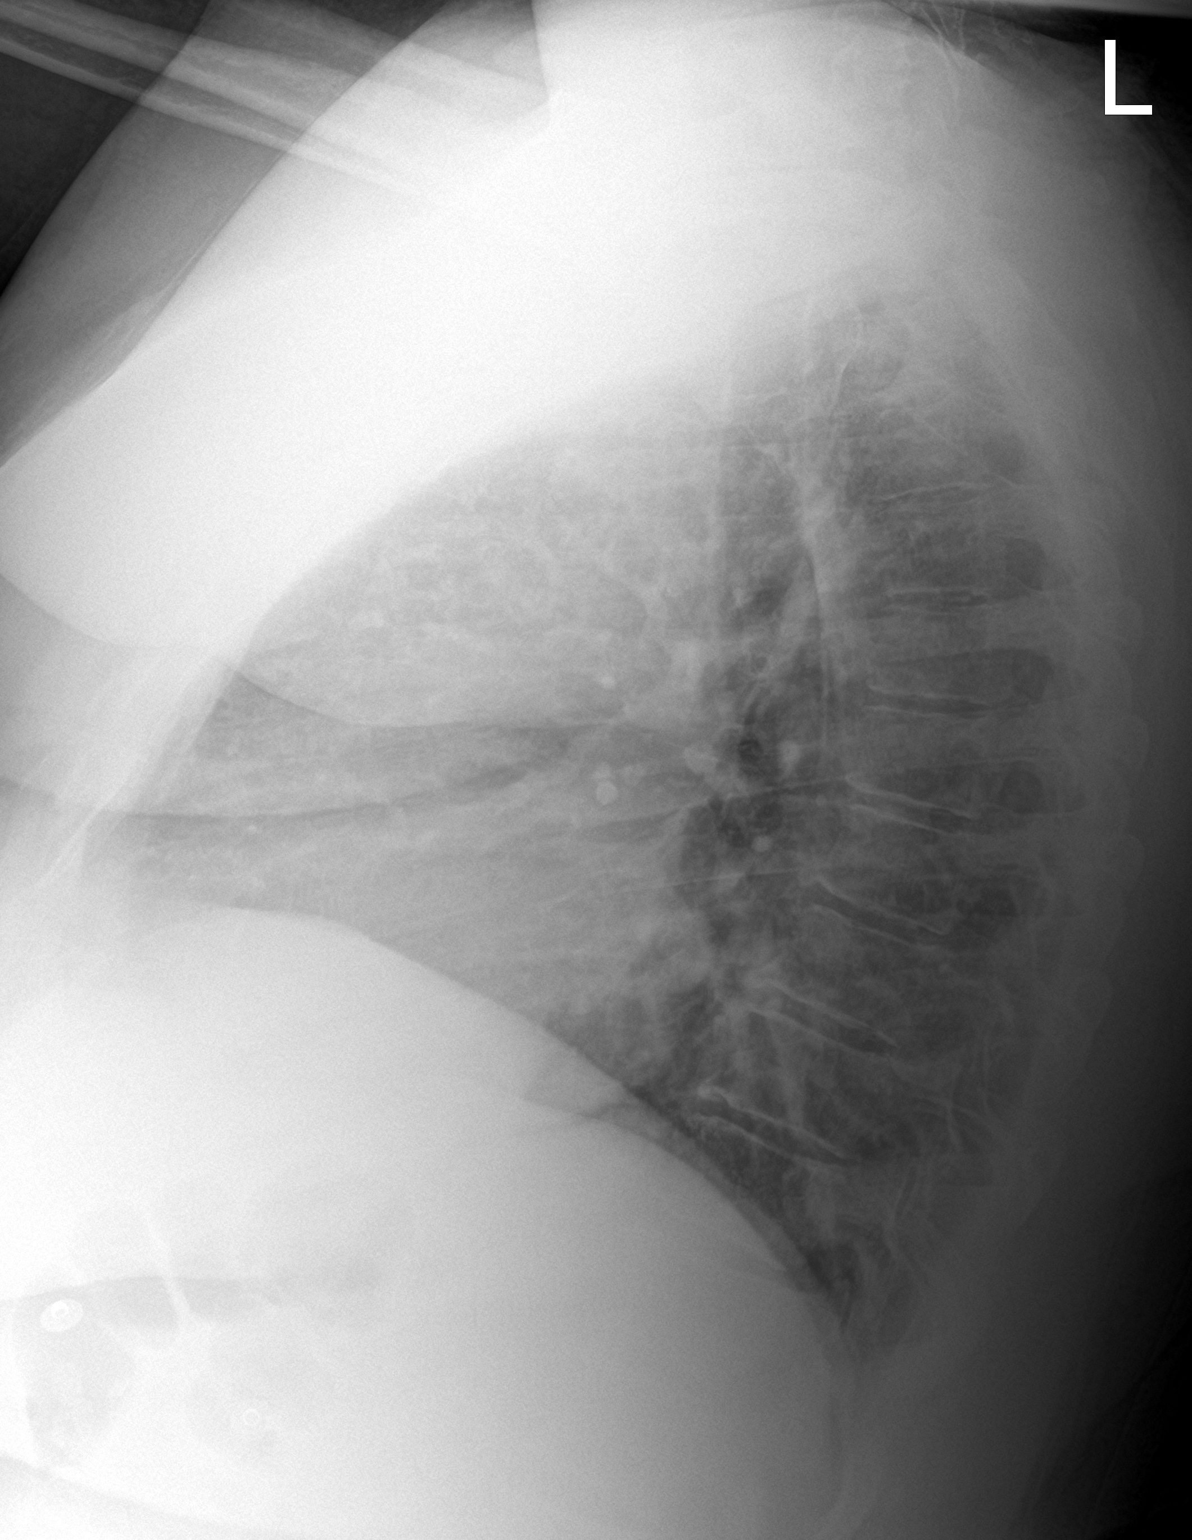

[chest ap]
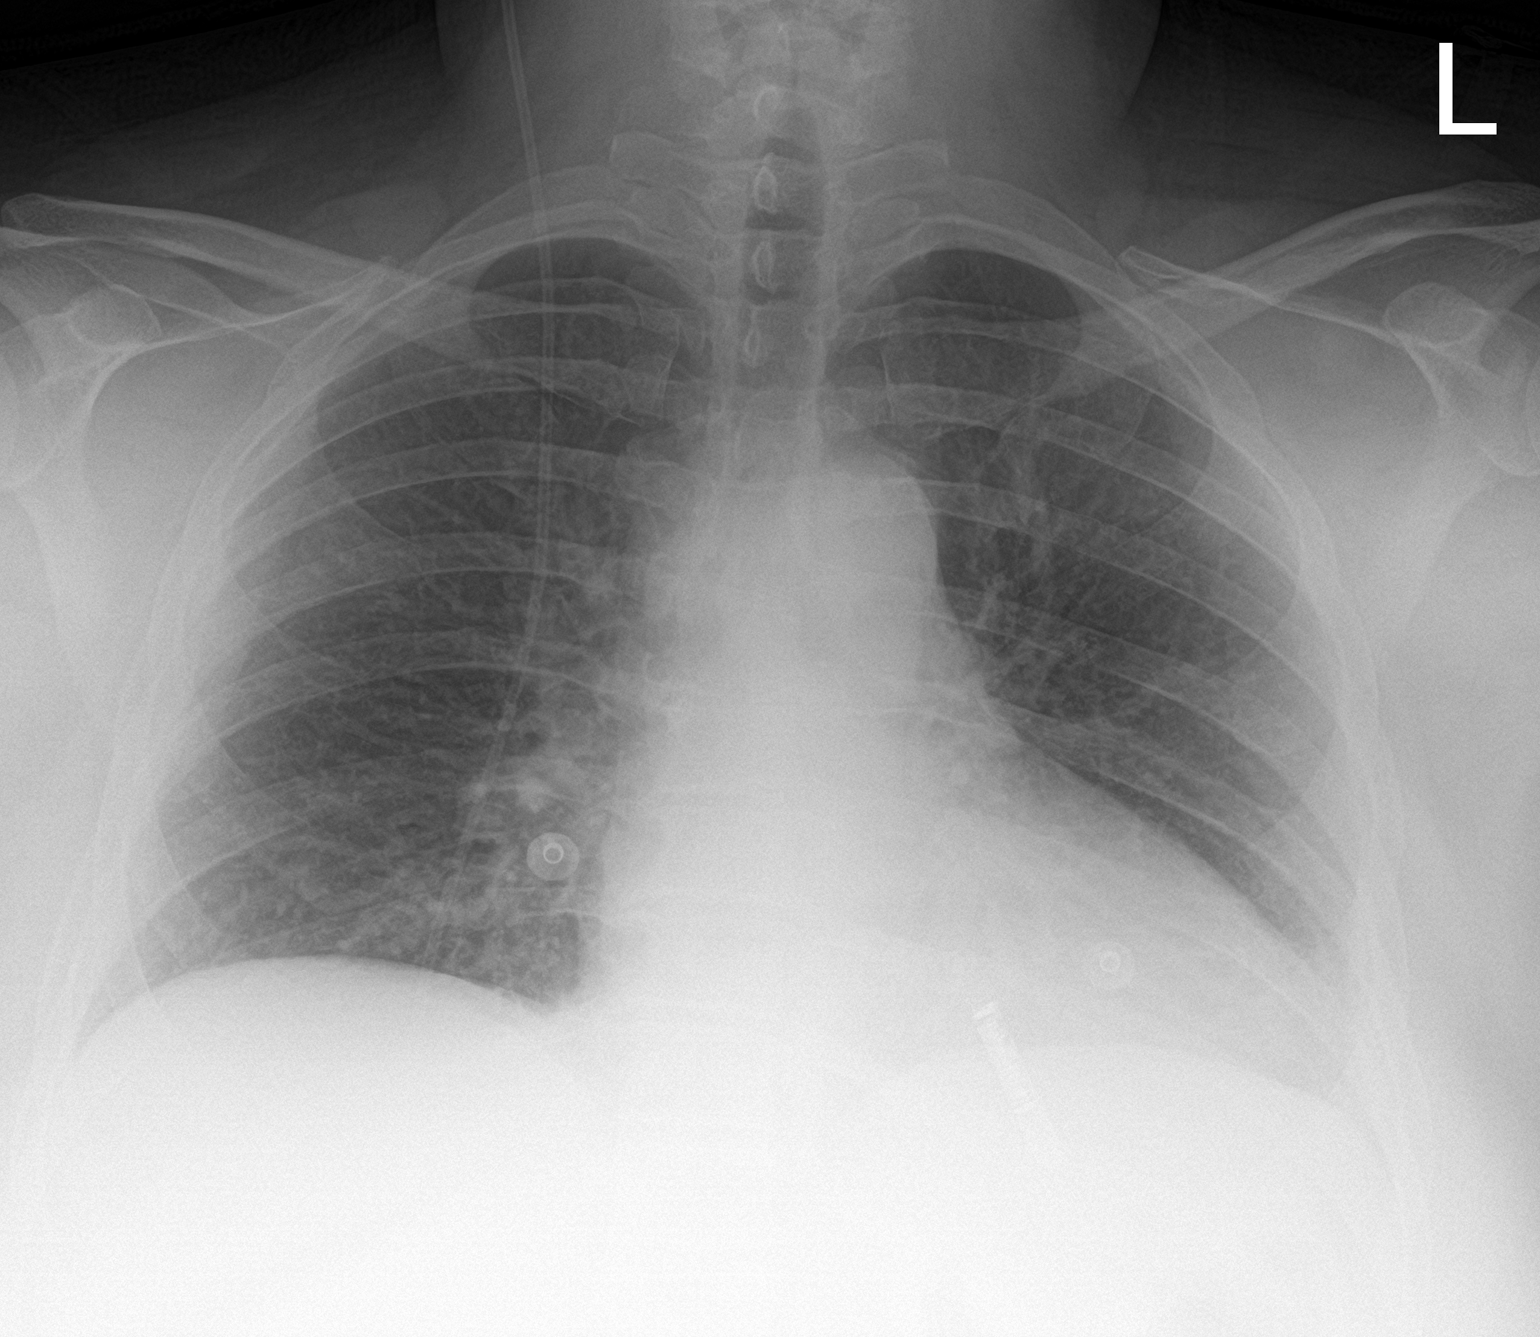

[2 of 2 positions shown; findings below may reference images not displayed]

FINDINGS: The heart size and mediastinal contours are stable. Lung volumes are
low. No consolidation, effusion, or pneumothorax. A loop recorder
device is present over the left chest. A tubular structure is
present over the cervical thoracic, and abdominal soft tissues,
suggesting ventriculoperitoneal shunt. No acute osseous abnormality.
IMPRESSION: No active cardiopulmonary disease.
